# Patient Record
Sex: Female | Born: 1935 | Race: White | Hispanic: No | Smoking: Never smoker
Health system: Southern US, Community
[De-identification: ages and names within clinical notes are randomized; demographics above are authoritative.]

## PROBLEM LIST (undated history)

## (undated) DIAGNOSIS — J45909 Unspecified asthma, uncomplicated: Secondary | ICD-10-CM

## (undated) DIAGNOSIS — I10 Essential (primary) hypertension: Secondary | ICD-10-CM

## (undated) DIAGNOSIS — E785 Hyperlipidemia, unspecified: Secondary | ICD-10-CM

## (undated) DIAGNOSIS — F0390 Unspecified dementia without behavioral disturbance: Secondary | ICD-10-CM

---

## 1998-06-08 ENCOUNTER — Ambulatory Visit (HOSPITAL_COMMUNITY): Admission: RE | Admit: 1998-06-08 | Discharge: 1998-06-08 | Payer: Self-pay | Admitting: Family Medicine

## 1999-06-20 ENCOUNTER — Encounter: Payer: Self-pay | Admitting: Family Medicine

## 1999-06-20 ENCOUNTER — Encounter: Admission: RE | Admit: 1999-06-20 | Discharge: 1999-06-20 | Payer: Self-pay | Admitting: Family Medicine

## 2000-06-30 ENCOUNTER — Ambulatory Visit (HOSPITAL_COMMUNITY): Admission: RE | Admit: 2000-06-30 | Discharge: 2000-06-30 | Payer: Self-pay | Admitting: Family Medicine

## 2000-06-30 ENCOUNTER — Encounter: Payer: Self-pay | Admitting: Family Medicine

## 2001-07-02 ENCOUNTER — Ambulatory Visit (HOSPITAL_COMMUNITY): Admission: RE | Admit: 2001-07-02 | Discharge: 2001-07-02 | Payer: Self-pay | Admitting: Family Medicine

## 2001-07-02 ENCOUNTER — Encounter: Payer: Self-pay | Admitting: Family Medicine

## 2002-07-09 ENCOUNTER — Other Ambulatory Visit: Admission: RE | Admit: 2002-07-09 | Discharge: 2002-07-09 | Payer: Self-pay | Admitting: Family Medicine

## 2002-08-11 ENCOUNTER — Ambulatory Visit (HOSPITAL_COMMUNITY): Admission: RE | Admit: 2002-08-11 | Discharge: 2002-08-11 | Payer: Self-pay | Admitting: Family Medicine

## 2002-08-11 ENCOUNTER — Encounter: Payer: Self-pay | Admitting: Family Medicine

## 2003-10-18 ENCOUNTER — Ambulatory Visit (HOSPITAL_COMMUNITY): Admission: RE | Admit: 2003-10-18 | Discharge: 2003-10-18 | Payer: Self-pay | Admitting: Family Medicine

## 2004-07-23 ENCOUNTER — Ambulatory Visit: Payer: Self-pay | Admitting: Family Medicine

## 2004-10-18 ENCOUNTER — Ambulatory Visit (HOSPITAL_COMMUNITY): Admission: RE | Admit: 2004-10-18 | Discharge: 2004-10-18 | Payer: Self-pay | Admitting: Family Medicine

## 2004-11-28 ENCOUNTER — Ambulatory Visit: Payer: Self-pay | Admitting: Family Medicine

## 2004-12-18 ENCOUNTER — Ambulatory Visit: Payer: Self-pay | Admitting: Gastroenterology

## 2005-01-01 ENCOUNTER — Ambulatory Visit: Payer: Self-pay | Admitting: Gastroenterology

## 2005-01-29 ENCOUNTER — Ambulatory Visit: Payer: Self-pay | Admitting: Family Medicine

## 2005-07-01 ENCOUNTER — Ambulatory Visit: Payer: Self-pay | Admitting: Family Medicine

## 2005-07-10 ENCOUNTER — Ambulatory Visit: Payer: Self-pay | Admitting: Family Medicine

## 2005-10-10 ENCOUNTER — Ambulatory Visit: Payer: Self-pay | Admitting: Family Medicine

## 2005-11-19 ENCOUNTER — Ambulatory Visit (HOSPITAL_COMMUNITY): Admission: RE | Admit: 2005-11-19 | Discharge: 2005-11-19 | Payer: Self-pay | Admitting: Family Medicine

## 2006-01-28 ENCOUNTER — Ambulatory Visit: Payer: Self-pay | Admitting: Family Medicine

## 2006-02-17 ENCOUNTER — Ambulatory Visit: Payer: Self-pay | Admitting: Family Medicine

## 2006-05-14 ENCOUNTER — Ambulatory Visit: Payer: Self-pay | Admitting: Family Medicine

## 2006-05-14 LAB — CONVERTED CEMR LAB
Hgb A1c MFr Bld: 6.4 % — ABNORMAL HIGH (ref 4.6–6.0)
Microalb Creat Ratio: 16.8 mg/g (ref 0.0–30.0)
Microalb, Ur: 2.1 mg/dL — ABNORMAL HIGH (ref 0.0–1.9)

## 2006-05-26 ENCOUNTER — Ambulatory Visit: Payer: Self-pay | Admitting: Family Medicine

## 2006-11-26 ENCOUNTER — Ambulatory Visit (HOSPITAL_COMMUNITY): Admission: RE | Admit: 2006-11-26 | Discharge: 2006-11-26 | Payer: Self-pay | Admitting: Unknown Physician Specialty

## 2011-07-15 ENCOUNTER — Ambulatory Visit: Payer: Self-pay | Admitting: Family Medicine

## 2012-09-30 ENCOUNTER — Ambulatory Visit: Payer: Self-pay | Admitting: Family Medicine

## 2012-10-21 ENCOUNTER — Ambulatory Visit: Payer: Self-pay | Admitting: Family Medicine

## 2015-06-20 DIAGNOSIS — B351 Tinea unguium: Secondary | ICD-10-CM | POA: Diagnosis not present

## 2015-06-20 DIAGNOSIS — M79676 Pain in unspecified toe(s): Secondary | ICD-10-CM | POA: Diagnosis not present

## 2015-06-20 DIAGNOSIS — M24576 Contracture, unspecified foot: Secondary | ICD-10-CM | POA: Diagnosis not present

## 2015-06-20 DIAGNOSIS — M2010 Hallux valgus (acquired), unspecified foot: Secondary | ICD-10-CM | POA: Diagnosis not present

## 2015-07-04 ENCOUNTER — Emergency Department: Payer: Medicare Other

## 2015-07-04 ENCOUNTER — Inpatient Hospital Stay
Admission: EM | Admit: 2015-07-04 | Discharge: 2015-07-07 | DRG: 550 | Disposition: A | Discharge status: Skilled Nursing Facility | Payer: Medicare Other | Attending: Internal Medicine | Admitting: Internal Medicine

## 2015-07-04 ENCOUNTER — Inpatient Hospital Stay: Payer: Medicare Other

## 2015-07-04 ENCOUNTER — Encounter: Payer: Self-pay | Admitting: Medical Oncology

## 2015-07-04 DIAGNOSIS — M25462 Effusion, left knee: Secondary | ICD-10-CM | POA: Diagnosis present

## 2015-07-04 DIAGNOSIS — M25562 Pain in left knee: Secondary | ICD-10-CM

## 2015-07-04 DIAGNOSIS — M11262 Other chondrocalcinosis, left knee: Secondary | ICD-10-CM | POA: Diagnosis not present

## 2015-07-04 DIAGNOSIS — R509 Fever, unspecified: Secondary | ICD-10-CM | POA: Diagnosis not present

## 2015-07-04 DIAGNOSIS — F0390 Unspecified dementia without behavioral disturbance: Secondary | ICD-10-CM | POA: Diagnosis present

## 2015-07-04 DIAGNOSIS — J45909 Unspecified asthma, uncomplicated: Secondary | ICD-10-CM | POA: Diagnosis not present

## 2015-07-04 DIAGNOSIS — M009 Pyogenic arthritis, unspecified: Principal | ICD-10-CM | POA: Diagnosis present

## 2015-07-04 DIAGNOSIS — R531 Weakness: Secondary | ICD-10-CM | POA: Diagnosis not present

## 2015-07-04 DIAGNOSIS — E559 Vitamin D deficiency, unspecified: Secondary | ICD-10-CM | POA: Diagnosis not present

## 2015-07-04 DIAGNOSIS — I10 Essential (primary) hypertension: Secondary | ICD-10-CM | POA: Diagnosis present

## 2015-07-04 DIAGNOSIS — J449 Chronic obstructive pulmonary disease, unspecified: Secondary | ICD-10-CM | POA: Diagnosis not present

## 2015-07-04 DIAGNOSIS — E785 Hyperlipidemia, unspecified: Secondary | ICD-10-CM | POA: Diagnosis present

## 2015-07-04 DIAGNOSIS — M25511 Pain in right shoulder: Secondary | ICD-10-CM

## 2015-07-04 DIAGNOSIS — Z7982 Long term (current) use of aspirin: Secondary | ICD-10-CM | POA: Diagnosis not present

## 2015-07-04 DIAGNOSIS — R0602 Shortness of breath: Secondary | ICD-10-CM | POA: Diagnosis not present

## 2015-07-04 DIAGNOSIS — Z66 Do not resuscitate: Secondary | ICD-10-CM | POA: Diagnosis present

## 2015-07-04 DIAGNOSIS — M11869 Other specified crystal arthropathies, unspecified knee: Secondary | ICD-10-CM | POA: Diagnosis not present

## 2015-07-04 HISTORY — DX: Hyperlipidemia, unspecified: E78.5

## 2015-07-04 HISTORY — DX: Unspecified asthma, uncomplicated: J45.909

## 2015-07-04 HISTORY — DX: Essential (primary) hypertension: I10

## 2015-07-04 HISTORY — DX: Unspecified dementia, unspecified severity, without behavioral disturbance, psychotic disturbance, mood disturbance, and anxiety: F03.90

## 2015-07-04 LAB — SYNOVIAL CELL COUNT + DIFF, W/ CRYSTALS
EOSINOPHILS-SYNOVIAL: 0 %
Lymphocytes-Synovial Fld: 6 %
MONOCYTE-MACROPHAGE-SYNOVIAL FLUID: 6 %
Neutrophil, Synovial: 88 %
OTHER CELLS-SYN: 0
WBC, SYNOVIAL: 29743 /mm3 — AB (ref 0–200)

## 2015-07-04 LAB — CBC WITH DIFFERENTIAL/PLATELET
Basophils Absolute: 0.1 10*3/uL (ref 0–0.1)
Basophils Relative: 1 %
EOS ABS: 0 10*3/uL (ref 0–0.7)
Eosinophils Relative: 0 %
HCT: 37.2 % (ref 35.0–47.0)
HEMOGLOBIN: 12.5 g/dL (ref 12.0–16.0)
LYMPHS ABS: 1 10*3/uL (ref 1.0–3.6)
Lymphocytes Relative: 8 %
MCH: 30.6 pg (ref 26.0–34.0)
MCHC: 33.5 g/dL (ref 32.0–36.0)
MCV: 91.2 fL (ref 80.0–100.0)
Monocytes Absolute: 1.3 10*3/uL — ABNORMAL HIGH (ref 0.2–0.9)
Monocytes Relative: 11 %
NEUTROS PCT: 80 %
Neutro Abs: 9.3 10*3/uL — ABNORMAL HIGH (ref 1.4–6.5)
PLATELETS: 189 10*3/uL (ref 150–440)
RBC: 4.08 MIL/uL (ref 3.80–5.20)
RDW: 14.2 % (ref 11.5–14.5)
WBC: 11.6 10*3/uL — AB (ref 3.6–11.0)

## 2015-07-04 LAB — RAPID INFLUENZA A&B ANTIGENS (ARMC ONLY): INFLUENZA B (ARMC): NEGATIVE

## 2015-07-04 LAB — RAPID INFLUENZA A&B ANTIGENS: Influenza A (ARMC): NEGATIVE

## 2015-07-04 LAB — COMPREHENSIVE METABOLIC PANEL
ALBUMIN: 3.4 g/dL — AB (ref 3.5–5.0)
ALK PHOS: 78 U/L (ref 38–126)
ALT: 30 U/L (ref 14–54)
ANION GAP: 11 (ref 5–15)
AST: 29 U/L (ref 15–41)
BILIRUBIN TOTAL: 1.5 mg/dL — AB (ref 0.3–1.2)
BUN: 14 mg/dL (ref 6–20)
CHLORIDE: 103 mmol/L (ref 101–111)
CO2: 25 mmol/L (ref 22–32)
CREATININE: 0.87 mg/dL (ref 0.44–1.00)
Calcium: 9.2 mg/dL (ref 8.9–10.3)
GFR calc Af Amer: >60 mL/min (ref >60)
GFR, EST NON AFRICAN AMERICAN: 59 mL/min — AB (ref >60)
Glucose, Bld: 161 mg/dL — ABNORMAL HIGH (ref 65–99)
POTASSIUM: 4 mmol/L (ref 3.5–5.1)
Sodium: 139 mmol/L (ref 135–145)
Total Protein: 7.1 g/dL (ref 6.5–8.1)

## 2015-07-04 LAB — URINALYSIS COMPLETE WITH MICROSCOPIC (ARMC ONLY)
BILIRUBIN URINE: NEGATIVE
Bacteria, UA: NONE SEEN
Glucose, UA: 50 mg/dL — AB
Hgb urine dipstick: NEGATIVE
KETONES UR: NEGATIVE mg/dL
Leukocytes, UA: NEGATIVE
Nitrite: NEGATIVE
Protein, ur: 30 mg/dL — AB
Specific Gravity, Urine: 1.023 (ref 1.005–1.030)
pH: 5 (ref 5.0–8.0)

## 2015-07-04 LAB — TROPONIN I

## 2015-07-04 LAB — MRSA PCR SCREENING: MRSA BY PCR: NEGATIVE

## 2015-07-04 LAB — SEDIMENTATION RATE: Sed Rate: 77 mm/hr — ABNORMAL HIGH (ref 0–30)

## 2015-07-04 MED ORDER — ALBUTEROL SULFATE (2.5 MG/3ML) 0.083% IN NEBU: 2.5000 mg | INHALATION_SOLUTION | Freq: Four times a day (QID) | RESPIRATORY_TRACT | Status: DC | PRN

## 2015-07-04 MED ORDER — ENOXAPARIN SODIUM 40 MG/0.4ML ~~LOC~~ SOLN
40.0000 mg | SUBCUTANEOUS | Status: DC
  Administered 2015-07-04 – 2015-07-07 (×4): 40 mg via SUBCUTANEOUS
  Filled 2015-07-04 (×3): qty 0.4

## 2015-07-04 MED ORDER — ALBUTEROL SULFATE (2.5 MG/3ML) 0.083% IN NEBU
2.5000 mg | INHALATION_SOLUTION | Freq: Two times a day (BID) | RESPIRATORY_TRACT | Status: DC
  Filled 2015-07-04: qty 3

## 2015-07-04 MED ORDER — HYDROCODONE-ACETAMINOPHEN 5-325 MG PO TABS
1.0000 | ORAL_TABLET | ORAL | Status: DC | PRN
  Administered 2015-07-04: 1 via ORAL
  Filled 2015-07-04: qty 1

## 2015-07-04 MED ORDER — MEMANTINE HCL 10 MG PO TABS
5.0000 mg | ORAL_TABLET | Freq: Two times a day (BID) | ORAL | Status: DC
  Administered 2015-07-04 – 2015-07-07 (×7): 5 mg via ORAL
  Filled 2015-07-04 (×7): qty 1

## 2015-07-04 MED ORDER — ACETAMINOPHEN 325 MG PO TABS: 650.0000 mg | ORAL_TABLET | Freq: Four times a day (QID) | ORAL | Status: DC | PRN

## 2015-07-04 MED ORDER — MORPHINE SULFATE (PF) 2 MG/ML IV SOLN
2.0000 mg | Freq: Once | INTRAVENOUS | Status: AC
  Administered 2015-07-04: 2 mg via INTRAVENOUS
  Filled 2015-07-04: qty 1

## 2015-07-04 MED ORDER — VANCOMYCIN HCL IN DEXTROSE 750-5 MG/150ML-% IV SOLN
750.0000 mg | INTRAVENOUS | Status: DC
  Administered 2015-07-05 – 2015-07-06 (×3): 750 mg via INTRAVENOUS
  Filled 2015-07-04 (×5): qty 150

## 2015-07-04 MED ORDER — SODIUM CHLORIDE 0.9 % IV BOLUS (SEPSIS)
500.0000 mL | Freq: Once | INTRAVENOUS | Status: AC
  Administered 2015-07-04: 500 mL via INTRAVENOUS

## 2015-07-04 MED ORDER — ACETAMINOPHEN 325 MG PO TABS: 650.0000 mg | ORAL_TABLET | Freq: Three times a day (TID) | ORAL | Status: DC | PRN

## 2015-07-04 MED ORDER — BISACODYL 5 MG PO TBEC: 5.0000 mg | DELAYED_RELEASE_TABLET | Freq: Every day | ORAL | Status: DC | PRN

## 2015-07-04 MED ORDER — VANCOMYCIN HCL IN DEXTROSE 750-5 MG/150ML-% IV SOLN
750.0000 mg | INTRAVENOUS | Status: AC
  Administered 2015-07-04: 750 mg via INTRAVENOUS
  Filled 2015-07-04: qty 150

## 2015-07-04 MED ORDER — ONDANSETRON HCL 4 MG/2ML IJ SOLN: 4.0000 mg | Freq: Four times a day (QID) | INTRAMUSCULAR | Status: DC | PRN

## 2015-07-04 MED ORDER — ALBUTEROL SULFATE (2.5 MG/3ML) 0.083% IN NEBU
2.5000 mg | INHALATION_SOLUTION | Freq: Two times a day (BID) | RESPIRATORY_TRACT | Status: DC
  Administered 2015-07-05 – 2015-07-07 (×4): 2.5 mg via RESPIRATORY_TRACT
  Filled 2015-07-04 (×4): qty 3

## 2015-07-04 MED ORDER — TRAZODONE HCL 50 MG PO TABS
25.0000 mg | ORAL_TABLET | Freq: Every evening | ORAL | Status: DC | PRN
  Administered 2015-07-06: 25 mg via ORAL
  Filled 2015-07-04: qty 1

## 2015-07-04 MED ORDER — ACETAMINOPHEN 650 MG RE SUPP: 650.0000 mg | Freq: Four times a day (QID) | RECTAL | Status: DC | PRN

## 2015-07-04 MED ORDER — KETOROLAC TROMETHAMINE 30 MG/ML IJ SOLN
15.0000 mg | Freq: Once | INTRAMUSCULAR | Status: AC
  Administered 2015-07-04: 15 mg via INTRAVENOUS
  Filled 2015-07-04: qty 1

## 2015-07-04 MED ORDER — CEFAZOLIN SODIUM 1-5 GM-% IV SOLN
1.0000 g | Freq: Once | INTRAVENOUS | Status: AC
  Administered 2015-07-04: 1 g via INTRAVENOUS
  Filled 2015-07-04: qty 50

## 2015-07-04 MED ORDER — LEVOFLOXACIN IN D5W 250 MG/50ML IV SOLN: 250.0000 mg | INTRAVENOUS | Status: DC

## 2015-07-04 MED ORDER — SODIUM CHLORIDE 0.9 % IV SOLN
INTRAVENOUS | Status: DC
  Administered 2015-07-04 – 2015-07-06 (×3): via INTRAVENOUS

## 2015-07-04 MED ORDER — LEVOFLOXACIN IN D5W 500 MG/100ML IV SOLN: 500.0000 mg | INTRAVENOUS | Status: DC

## 2015-07-04 MED ORDER — ASPIRIN 81 MG PO CHEW
81.0000 mg | CHEWABLE_TABLET | Freq: Every day | ORAL | Status: DC
  Administered 2015-07-04 – 2015-07-07 (×4): 81 mg via ORAL
  Filled 2015-07-04 (×4): qty 1

## 2015-07-04 MED ORDER — DOCUSATE SODIUM 100 MG PO CAPS
100.0000 mg | ORAL_CAPSULE | Freq: Two times a day (BID) | ORAL | Status: DC
  Administered 2015-07-04 – 2015-07-07 (×7): 100 mg via ORAL
  Filled 2015-07-04 (×7): qty 1

## 2015-07-04 MED ORDER — AMLODIPINE BESYLATE 5 MG PO TABS
2.5000 mg | ORAL_TABLET | Freq: Every day | ORAL | Status: DC
  Administered 2015-07-04 – 2015-07-07 (×4): 2.5 mg via ORAL
  Filled 2015-07-04 (×4): qty 1

## 2015-07-04 MED ORDER — PRAVASTATIN SODIUM 20 MG PO TABS
40.0000 mg | ORAL_TABLET | Freq: Every day | ORAL | Status: DC
  Administered 2015-07-04 – 2015-07-07 (×4): 40 mg via ORAL
  Filled 2015-07-04 (×4): qty 2

## 2015-07-04 MED ORDER — DONEPEZIL HCL 5 MG PO TABS
5.0000 mg | ORAL_TABLET | Freq: Every day | ORAL | Status: DC
  Administered 2015-07-04 – 2015-07-06 (×3): 5 mg via ORAL
  Filled 2015-07-04 (×3): qty 1

## 2015-07-04 MED ORDER — ONDANSETRON HCL 4 MG PO TABS: 4.0000 mg | ORAL_TABLET | Freq: Four times a day (QID) | ORAL | Status: DC | PRN

## 2015-07-04 MED ORDER — IPRATROPIUM-ALBUTEROL 0.5-2.5 (3) MG/3ML IN SOLN
3.0000 mL | Freq: Once | RESPIRATORY_TRACT | Status: AC
  Administered 2015-07-04: 3 mL via RESPIRATORY_TRACT
  Filled 2015-07-04: qty 3

## 2015-07-04 MED ORDER — VANCOMYCIN HCL IN DEXTROSE 1-5 GM/200ML-% IV SOLN: 1000.0000 mg | INTRAVENOUS | Status: DC

## 2015-07-04 MED ORDER — CITALOPRAM HYDROBROMIDE 20 MG PO TABS
20.0000 mg | ORAL_TABLET | Freq: Every day | ORAL | Status: DC
  Administered 2015-07-04 – 2015-07-07 (×4): 20 mg via ORAL
  Filled 2015-07-04 (×4): qty 1

## 2015-07-04 NOTE — ED Notes (Signed)
[facility] staff reports that pt woke up c/o pain all over, pt reports pain to knee. Pt confused per baseline. Staff reports pt may have uti. Pt afebrile.

## 2015-07-04 NOTE — ED Notes (Signed)
Pt will be transported to [facility] once returns from [facility]. Pt's family made aware and verbalized understanding.

## 2015-07-04 NOTE — Consult Note (Signed)
Patient is seen for evaluation of left knee swelling pain and warmth, possible septic joint. She is confused and a poor historian her daughters with her and helps explain was been going on. She does have significant osteoarthritis but did not want to have knee replacement many years ago and is essentially bone been bone-on-bone in the left knee. She has not had a swelling like this in the past however.  On examination she has a very large effusion to the left knee with pain with any attempted motion, she holds the knee in slight flexion. There is some warmth to the knee. She is tender diffusely.  X-rays were reviewed and show significant tricompartmental osteoarthritis with some chondrocalcinosis  Impression acutely swollen knee with warmth, possible septic joint, possible gout or pseudogout attack  Plan knee was aspirated and 40 cc of fluid withdrawn that was cloudy this will be sent for synovial fluid analysis and culture. If it is pseudogout or gout will come back tomorrow and inject with corticosteroid if positive for infection would recommend arthroscopy

## 2015-07-04 NOTE — Progress Notes (Addendum)
Pharmacy Antibiotic Note  Maurine Ministerhelma A Marques is a 82 y.o. female admitted on 07/04/2015 with fever of unknown origin, r/o septic arthritis.  Pharmacy has been consulted for vancomycin dosing.  Ke: 0.042 Vd: 42.3  Plan: Vancomycin 750 mg iv q 18 hours with stacked dosing and a trough with the 5th total dose. Goal trough 15-20 mcg/ml.   Height: 5\' 1"  (154.9 cm) Weight: 175 lb (79.379 kg) IBW/kg (Calculated) : 47.8  Temp (24hrs), Avg:99.4 F (37.4 C), Min:98.3 F (36.8 C), Max:100.4 F (38 C)   Recent Labs Lab 07/04/15 0827  WBC 11.6*  CREATININE 0.87    Estimated Creatinine Clearance: 45.1 mL/min (by C-G formula based on Cr of 0.87).    No Known Allergies  Antimicrobials this admission: cefazolin 4/25 >> 4/25 Vancomycin 4/25 >>   Dose adjustments this admission:   Microbiology results: BCx: pending UCx: pending    Thank you for allowing pharmacy to be a part of this patient's care.  Luisa HartChristy, Kezia Benevides D 07/04/2015 2:12 PM

## 2015-07-04 NOTE — H&P (Addendum)
[facility] [place] NAME: Stephanie Duarte    MR#:  [id number]  DATE OF BIRTH:  Jul 14, 1928  DATE OF ADMISSION:  07/04/2015  PRIMARY CARE PHYSICIAN: No primary care provider on file.   REQUESTING/REFERRING PHYSICIAN: Dr. Meade Maw  CHIEF COMPLAINT: Left knee pain and right shoulder pain    Chief Complaint  Patient presents with  . Generalized Body Aches  . Knee Pain    HISTORY OF PRESENT ILLNESS:  Stephanie Duarte  is a 82 y.o. female with a known history of Dementia, hypertension, asthma sent in from [place] resolved. Dementia care unit secondary to severe left knee pain and unable to ambulate since this morning. Patient also had right shoulder pain. Patient usually walks with walker but this morning she couldn't breath and she had severe pain and for screening because of the pain in the left knee. The patient had temperature of 100.6 f. According to the family s,he had ambulatory difficulties for the past few days. Decreased by mouth intake today.  PAST MEDICAL HISTORY:   Past Medical History  Diagnosis Date  . Dementia   . Hypertension   . Hyperlipidemia   . Asthma     PAST SURGICAL HISTOIRY:  No past surgical history on file. History of spinal surgeries for the bone spur, lower back surgery for herniated disc, laparoscopic knee surgery on the left knee. SOCIAL HISTORY:   Social History  Substance Use Topics  . Smoking status: Never Smoker   . Smokeless tobacco: Not on file  . Alcohol Use: Not on file    FAMILY HISTORY:  No family history on file.  DRUG ALLERGIES:  No Known Allergies  REVIEW OF SYSTEMS:Unable to obtain review of systems secondary to dementia     MEDICATIONS AT HOME:   Prior to Admission medications   Medication Sig Start Date End Date Taking? Authorizing Provider  acetaminophen (TYLENOL) 325 MG tablet Take 650 mg by mouth 3 (three) times daily as needed for mild pain.   Yes Historical Provider,  MD  albuterol (ACCUNEB) 0.63 MG/3ML nebulizer solution Take 1 ampule by nebulization 2 (two) times daily. Pt also uses every six hours as needed for wheezing.   Yes Historical Provider, MD  albuterol (PROVENTIL HFA;VENTOLIN HFA) 108 (90 Base) MCG/ACT inhaler Inhale 2 puffs into the lungs every 6 (six) hours as needed for wheezing or shortness of breath (and/or cough).   Yes Historical Provider, MD  amLODipine (NORVASC) 2.5 MG tablet Take 2.5 mg by mouth daily.   Yes Historical Provider, MD  aspirin 81 MG chewable tablet Chew 81 mg by mouth daily.   Yes Historical Provider, MD  cholecalciferol (VITAMIN D) 1000 units tablet Take 4,000 Units by mouth daily.   Yes Historical Provider, MD  citalopram (CELEXA) 20 MG tablet Take 20 mg by mouth daily.   Yes Historical Provider, MD  donepezil (ARICEPT) 5 MG tablet Take 5 mg by mouth at bedtime.   Yes Historical Provider, MD  memantine (NAMENDA) 5 MG tablet Take 5 mg by mouth 2 (two) times daily.   Yes Historical Provider, MD  pravastatin (PRAVACHOL) 40 MG tablet Take 40 mg by mouth daily.   Yes Historical Provider, MD      VITAL SIGNS:  Blood pressure 137/72, pulse 88, temperature 100.4 F (38 C), temperature source Rectal, resp. rate 16, height '5\' 1"'$  (1.549 m), weight 79.379 kg (175 lb), SpO2 98 %.  PHYSICAL EXAMINATION:  GENERAL:  82 y.o.-year-old patient  lying in the bed with left knee pain EYES: Pupils equal, round, reactive to light and accommodation. No scleral icterus. Extraocular muscles intact.  HEENT: Head atraumatic, normocephalic. Oropharynx and nasopharynx clear.  NECK:  Supple, no jugular venous distention. No thyroid enlargement, no tenderness.  LUNGS: Normal breath sounds bilaterally, no wheezing, rales,rhonchi or crepitation. No use of accessory muscles of respiration.  CARDIOVASCULAR: S1, S2 normal. No murmurs, rubs, or gallops.  ABDOMEN: Soft, nontender, nondistended. Bowel sounds present. No organomegaly or mass.  EXTREMITIES:  Left knee is swollen, tender to palpation at the medial aspect no redness or erythema. Decreased range of motion secondary to pain. Right knee is not swollen, range of motion is within normal limits.  NEUROLOGIC: Cranial nerves II through XII are intact. Muscle strength 5/5 in all extremities. Sensation intact. Gait not checked.  PSYCHIATRIC: The patient is awake but demented.  SKIN: No obvious rash, lesion, or ulcer.   LABORATORY PANEL:   CBC  Recent Labs Lab 07/04/15 0827  WBC 11.6*  HGB 12.5  HCT 37.2  PLT 189   ------------------------------------------------------------------------------------------------------------------  Chemistries   Recent Labs Lab 07/04/15 0827  NA 139  K 4.0  CL 103  CO2 25  GLUCOSE 161*  BUN 14  CREATININE 0.87  CALCIUM 9.2  AST 29  ALT 30  ALKPHOS 78  BILITOT 1.5*   ------------------------------------------------------------------------------------------------------------------  Cardiac Enzymes  Recent Labs Lab 07/04/15 0827  TROPONINI <0.03   ------------------------------------------------------------------------------------------------------------------  RADIOLOGY:  Dg Chest 2 View  07/04/2015  CLINICAL DATA:  Shortness of breath.  Diffuse pain. EXAM: CHEST  2 VIEW COMPARISON:  10/21/2012 FINDINGS: The cardiac silhouette is upper limits of normal in size. There is increased density in the upper mediastinum to the right of the trachea which is more prominent than on the prior study though not completely new, in there is mild leftward deviation of the trachea at the thoracic inlet. Atherosclerotic calcification and tortuosity are again seen of the thoracic aorta. No airspace consolidation, edema, pleural effusion, pneumothorax is identified. No acute osseous abnormality is seen. IMPRESSION: 1. No evidence of acute cardiopulmonary process. 2. Increased right paratracheal density with mild mass effect on the trachea at the thoracic  inlet, possibly reflecting thyroid enlargement although other mediastinal mass and vascular compression are also considerations. Consider further evaluation with outpatient chest CT. Electronically Signed   By: Logan Bores M.D.   On: 07/04/2015 09:35   Dg Shoulder Right  07/04/2015  CLINICAL DATA:  All over pain of right shoulder.  No injury. EXAM: RIGHT SHOULDER - 2+ VIEW COMPARISON:  None. FINDINGS: There is no evidence of fracture or dislocation. There are degenerative joint changes with narrowed joint of the right shoulder. Chronic posttraumatic deformity is identified in one of the lower right ribs. Soft tissues are unremarkable. IMPRESSION: No acute fracture or dislocation. Degenerative joint changes of right shoulder. Electronically Signed   By: Abelardo Diesel M.D.   On: 07/04/2015 09:35   Dg Knee Complete 4 Views Left  07/04/2015  CLINICAL DATA:  Pain to the left knee with no injury. EXAM: LEFT KNEE - COMPLETE 4+ VIEW COMPARISON:  None. FINDINGS: There is no evidence of fracture, dislocation. There is a suprapatellar effusion. There are degenerative joint changes of left knee with narrowed sub joint space and osteophyte formation. Chondrocalcinosis is identified in the femoral tibial joint space. Soft tissues are unremarkable. IMPRESSION: No acute fracture or dislocation. Degenerative joint changes of left knee. Electronically Signed   By: Mallie Darting.D.  On: 07/04/2015 09:34    EKG:  No orders found for this or any previous visit.  IMPRESSION AND PLAN:   #1 fever with left knee swelling and pain concerning for septic arthritis: Patient initial workup with x-ray of the left knee showed bony degeneration but  ESR is  77.will get  Ortho consult for arthoscentesis and synovial fluid analysis.check CRP also got empiric antibiotic with s vancomycin but cover for MRSA as the patient is from nursing home.   #2 dementia;continue namenda 3. Given her elevated white count and a left knee pain  concerning for septic arthritis but unable to rule out inflammatory arthritis like gout, possible UTI: Blood cultures and urine cultures are ordered. Influenza test is negative. Continue pain medications with Percocet. #4 ambulatory difficulty probably secondary to knee pain:'  #5 CODE STATUS DO NOT RESUSCITATE Discussed with patient's daughter   All the records are reviewed and case discussed with ED provider. Management plans discussed with the patient, family and they are in agreement.  CODE STATUS:dnr   TOTAL TIME TAKING CARE OF THIS PATIENT:55 minutes.    Epifanio Lesches M.D on 07/04/2015 at 2:06 PM  Between 7am to 6pm - Pager - [phone]  After 6pm go to [email or web address] - password [email or web address] [facility]  [facility]  Office  [phone]  CC: Primary care physician; No primary care provider on file.  Note: This dictation was prepared with Dragon dictation along with smaller phrase technology. Any transcriptional errors that result from this process are unintentional.

## 2015-07-04 NOTE — ED Notes (Signed)
Patient transported to Ultrasound 

## 2015-07-04 NOTE — ED Provider Notes (Signed)
Time Seen: Approximately 0 900 I have reviewed the triage notes  Chief Complaint: Generalized Body Aches and Knee Pain   History of Present Illness: Stephanie Duarte is a 82 y.o. female who presents with complaints of some diffuse body aches. Patient's had a history of inflammation in her left knee and the family states that she has had some difficulty ambulating over the last couple of days. Patient seems to be slightly confused per her baseline. She does have a history of dementia. The patient outside of her knee pain is complained of right shoulder pain. She's had a dry nonproductive cough. The patient denies any chest pain at this time. She denies any abdominal discomfort. There is been no loose stool or diarrhea. No nausea or vomiting. The family states that the patient's also complaining of right shoulder pain.   Past Medical History  Diagnosis Date  . Dementia   . Hypertension   . Hyperlipidemia   . Asthma     There are no active problems to display for this patient.   No past surgical history on file.  No past surgical history on file.  No current outpatient prescriptions on file.  Allergies:  Review of patient's allergies indicates no known allergies.  Family History: No family history on file.  Social History: Social History  Substance Use Topics  . Smoking status: Never Smoker   . Smokeless tobacco: None  . Alcohol Use: None     Review of Systems:   10 point review of systems was performed and was otherwise negative:  Constitutional:Fever noted rectally here today Eyes: No visual disturbances ENT: No sore throat, ear pain Cardiac: No chest pain Respiratory: No shortness of breath, wheezing, or stridor Abdomen: No abdominal pain, no vomiting, No diarrhea Endocrine: No weight loss, No night sweats Extremities: No peripheral edema, cyanosis Skin: No rashes, easy bruising Neurologic: No focal weakness, trouble with speech or swollowing Urologic: No  dysuria, Hematuria, or urinary frequency   Physical Exam:  ED Triage Vitals  Enc Vitals Group     BP 07/04/15 0817 144/78 mmHg     Pulse Rate 07/04/15 0817 90     Resp 07/04/15 0817 20     Temp 07/04/15 0817 98.3 F (36.8 C)     Temp Source 07/04/15 0817 Oral     SpO2 07/04/15 0817 92 %     Weight 07/04/15 0817 175 lb (79.379 kg)     Height 07/04/15 0817 5\' 1"  (1.549 m)     Head Cir --      Peak Flow --      Pain Score --      Pain Loc --      Pain Edu? --      Excl. in GC? --     General: Awake , Alert , and Oriented times 1. Patient's cooperative and able to follow commands Head: Normal cephalic , atraumatic Eyes: Pupils equal , round, reactive to light Nose/Throat: No nasal drainage, patent upper airway without erythema or exudate.  Neck: Supple, Full range of motion, No anterior adenopathy or palpable thyroid masses Lungs: Some rhonchi auscultated bilaterally to bases without any wheezes or rales noted  Heart: Regular rate, regular rhythm without murmurs , gallops , or rubs Abdomen: Soft, non tender without rebound, guarding , or rigidity; bowel sounds positive and symmetric in all 4 quadrants. No organomegaly .        Extremities patient has some mild edema and diffuse tenderness across the left  knee without any ballottement or obvious erythema. Limited range of motion secondary to pain Neurologic: normal ambulation, Motor symmetric without deficits, sensory intact Skin: warm, dry, no rashes   Labs:   All laboratory work was reviewed including any pertinent negatives or positives listed below:  Labs Reviewed  COMPREHENSIVE METABOLIC PANEL - Abnormal; Notable for the following:    Glucose, Bld 161 (*)    Albumin 3.4 (*)    Total Bilirubin 1.5 (*)    GFR calc non Af Amer 59 (*)    All other components within normal limits  CBC WITH DIFFERENTIAL/PLATELET - Abnormal; Notable for the following:    WBC 11.6 (*)    Neutro Abs 9.3 (*)    Monocytes Absolute 1.3 (*)     All other components within normal limits  URINALYSIS COMPLETEWITH MICROSCOPIC ([facility] ONLY) - Abnormal; Notable for the following:    Color, Urine AMBER (*)    APPearance CLEAR (*)    Glucose, UA 50 (*)    Protein, ur 30 (*)    Squamous Epithelial / LPF 0-5 (*)    All other components within normal limits  SEDIMENTATION RATE - Abnormal; Notable for the following:    Sed Rate 77 (*)    All other components within normal limits  RAPID INFLUENZA A&B ANTIGENS ([facility] ONLY)  CULTURE, BLOOD (ROUTINE X 2)  CULTURE, BLOOD (ROUTINE X 2)  URINE CULTURE  TROPONIN I    Radiology:    EXAM: CHEST 2 VIEW  COMPARISON: 10/21/2012  FINDINGS: The cardiac silhouette is upper limits of normal in size. There is increased density in the upper mediastinum to the right of the trachea which is more prominent than on the prior study though not completely new, in there is mild leftward deviation of the trachea at the thoracic inlet. Atherosclerotic calcification and tortuosity are again seen of the thoracic aorta. No airspace consolidation, edema, pleural effusion, pneumothorax is identified. No acute osseous abnormality is seen.  IMPRESSION: 1. No evidence of acute cardiopulmonary process. 2. Increased right paratracheal density with mild mass effect on the trachea at the thoracic inlet, possibly reflecting thyroid enlargement although other mediastinal mass and vascular compression are also considerations. Consider further evaluation with outpatient chest CT.   Electronically Signed By: Sebastian Ache M.D. On: 07/04/2015 09:35          DG Knee Complete 4 Views Left (Final result) Result time: 07/04/15 09:34:16   Final result by Rad Results In Interface (07/04/15 09:34:16)   Narrative:   CLINICAL DATA: Pain to the left knee with no injury.  EXAM: LEFT KNEE - COMPLETE 4+ VIEW  COMPARISON: None.  FINDINGS: There is no evidence of fracture, dislocation. There is  a suprapatellar effusion. There are degenerative joint changes of left knee with narrowed sub joint space and osteophyte formation. Chondrocalcinosis is identified in the femoral tibial joint space. Soft tissues are unremarkable.  IMPRESSION: No acute fracture or dislocation. Degenerative joint changes of left knee.         I personally reviewed the radiologic studies     ED Course: * Patient was evaluated mostly for a fever of unknown etiology. She does have some mild hypoxia and has some mediastinal findings on her chest x-ray but no obvious infiltrate. Patient's knee is tender but does not appear to have a significant joint infection at this time. The source for the fever at this time remains unknown and according to the family's she's had difficulty with the joint pain for last  couple of days. She is currently not on any significant pain medication. She was given IV morphine here in emergency department.    Assessment: * Fever or unknown etiology Joint inflammation  Final Clinical Impression:  Final diagnoses:  Shoulder pain, acute, right  Fever of unknown origin     Plan:  Patient's case was reviewed with the hospitalist team, further disposition and management is per their evaluation.           Jennye Moccasin, MD 07/04/15 1322

## 2015-07-05 LAB — GLUCOSE, CAPILLARY: Glucose-Capillary: 117 mg/dL — ABNORMAL HIGH (ref 65–99)

## 2015-07-05 LAB — CBC
HCT: 33.5 % — ABNORMAL LOW (ref 35.0–47.0)
HEMOGLOBIN: 11.2 g/dL — AB (ref 12.0–16.0)
MCH: 30.6 pg (ref 26.0–34.0)
MCHC: 33.3 g/dL (ref 32.0–36.0)
MCV: 91.8 fL (ref 80.0–100.0)
PLATELETS: 192 10*3/uL (ref 150–440)
RBC: 3.65 MIL/uL — AB (ref 3.80–5.20)
RDW: 14.3 % (ref 11.5–14.5)
WBC: 10.5 10*3/uL (ref 3.6–11.0)

## 2015-07-05 LAB — BASIC METABOLIC PANEL
ANION GAP: 8 (ref 5–15)
BUN: 20 mg/dL (ref 6–20)
CHLORIDE: 102 mmol/L (ref 101–111)
CO2: 26 mmol/L (ref 22–32)
CREATININE: 0.94 mg/dL (ref 0.44–1.00)
Calcium: 8.5 mg/dL — ABNORMAL LOW (ref 8.9–10.3)
GFR calc non Af Amer: 54 mL/min — ABNORMAL LOW (ref >60)
Glucose, Bld: 124 mg/dL — ABNORMAL HIGH (ref 65–99)
POTASSIUM: 3.7 mmol/L (ref 3.5–5.1)
SODIUM: 136 mmol/L (ref 135–145)

## 2015-07-05 LAB — URINE CULTURE: CULTURE: NO GROWTH

## 2015-07-05 LAB — C-REACTIVE PROTEIN: CRP: 23.6 mg/dL — AB (ref <1.0)

## 2015-07-05 MED ORDER — TRIAMCINOLONE ACETONIDE 40 MG/ML IJ SUSP
40.0000 mg | Freq: Once | INTRAMUSCULAR | Status: AC
  Administered 2015-07-05: 40 mg via INTRA_ARTICULAR
  Filled 2015-07-05: qty 1

## 2015-07-05 NOTE — Evaluation (Signed)
Physical Therapy Evaluation Patient Details Name: Stephanie Duarte MRN: [id number] DOB: 05/10/1928 Today's Date: 07/05/2015   History of Present Illness  Pt is a 82 y.o. F admitted to hospital for pain in L knee and R shoulder. Per pts family, pt has not been able to ambulate well in past couple of days d/t knee pain. Pt received injection on 4/26. Pt has hx of dementia, HTN, hyperlipidemia, and asthma. [facility].  Clinical Impression  Pt is a pleasant 82 y.o. F [facility] L knee and R shoulder pain. Prior to admission, pt required assist with ADLs and used RW for ambulation. Pt confused regarding orientation during evaluation. Pt has dementia at baseline. Pts son present to confirm pts PLOF. Pt able to perform bed mobility with 2+ max assist. Pt able to transfer with RW and 2+ max assist. Pt able to ambulate approx 3 ft by side-stepping at EOB. Pt anxious during ambulation about falling. Pt demonstrated labored breathing, however O2 at 93% after ambulation. Pt able to perform supine there-ex, requiring max assist for L LE. Pt demonstrates deficits in strength, ROM, balance and mobility. Pt would benefit from further skilled PT to address deficits and return to PLOF; recommend pt sent to SNF after discharge from acute hospitalization.     Follow Up Recommendations SNF    Equipment Recommendations       Recommendations for Other Services       Precautions / Restrictions Precautions Precautions: Fall Restrictions Weight Bearing Restrictions: No      Mobility  Bed Mobility Overal bed mobility: Needs Assistance;+2 for physical assistance Bed Mobility: Supine to Sit     Supine to sit: +2 for physical assistance;Max assist     General bed mobility comments: Pt able to perform bed mobility with 2+ max assist. Pt required assist moving B LE and to upright trunk. Pt demonstrated guarding of L LE during mobility. Pt provided heavy cues regarding  hand placement.   Transfers Overall transfer level: Needs assistance Equipment used: Rolling walker (2 wheeled) Transfers: Sit to/from Stand Sit to Stand: Max assist;+2 physical assistance         General transfer comment: Pt able to transfer from EOB using RW and 2+ max assist. Pt provided heavy cues regarding hand and foot placement prior to transfer. Pt required increased time to maintain stand and upright posture. Pt demonstrates decreased WB on L LE.   Ambulation/Gait Ambulation/Gait assistance: Max assist;+2 physical assistance Ambulation Distance (Feet): 3 Feet Assistive device: Rolling walker (2 wheeled) Gait Pattern/deviations: Step-to pattern;Shuffle Gait velocity: slow   General Gait Details: Pt able to ambulate approx 3 ft taking side steps at EOB with RW and 2+ max assist. Pt required heavy verbal and tactile cues for weight shift and moving B LE. Pt demonstrates shuffle gait with short steps and decreased WB on L LE. Pt demonstrates posterior lean during gait, instructed to lean forward and upright posture. Ambulation limited by pts fatigue and weakness.   Stairs            Wheelchair Mobility    Modified Rankin (Stroke Patients Only)       Balance Overall balance assessment: Needs assistance Sitting-balance support: Bilateral upper extremity supported Sitting balance-Leahy Scale: Poor Sitting balance - Comments: Pt unable to maintain sitting balance on EOB without help from PT to upright trunk. Pt demonstrates poor postural control.    Standing balance support: Bilateral upper extremity supported Standing balance-Leahy  Scale: Poor Standing balance comment: Pt demonstrates posterior lean while standing with RW and 2+ max assist.                              Pertinent Vitals/Pain Pain Assessment: Faces Faces Pain Scale: Hurts even more Pain Location: L knee, headache Pain Descriptors / Indicators: Aching Pain Intervention(s): Limited  activity within patient's tolerance    Home Living Family/patient expects to be discharged to:: Assisted living               Home Equipment: Walker - 2 wheels Additional Comments: Pt lives at [facility]. Pt provided assist with ADLs.     Prior Function Level of Independence: Needs assistance   Gait / Transfers Assistance Needed: Pt able to ambulate using RW.   ADL's / Homemaking Assistance Needed: Pt requires assist with ADLs         Hand Dominance        Extremity/Trunk Assessment   Upper Extremity Assessment: RUE deficits/detail;LUE deficits/detail RUE Deficits / Details: R UE grossly 4-/5, limited shoulder flex ROM     LUE Deficits / Details: L UE grossly 4-/5, limited shoulder flex ROM   Lower Extremity Assessment: RLE deficits/detail;LLE deficits/detail RLE Deficits / Details: R LE grossly 4/5 strength LLE Deficits / Details: L LE grossly 2/5 strength, limited ROM d/t pain.      Communication   Communication: No difficulties  Cognition Arousal/Alertness: Awake/alert Behavior During Therapy: Anxious Overall Cognitive Status: History of cognitive impairments - at baseline       Memory: Decreased short-term memory              General Comments      Exercises Other Exercises Other Exercises: Pt performed supine ther-ex including B SLR, hip ab/ad, ankle pumps, and heel slides. Pt required min assist with R LE and max assist w/L LE. All ther-ex performed x10 reps, except for SLR on L side d/t pain.       Assessment/Plan    PT Assessment Patient needs continued PT services  PT Diagnosis Difficulty walking;Abnormality of gait;Generalized weakness;Acute pain   PT Problem List Decreased strength;Decreased range of motion;Decreased activity tolerance;Decreased balance;Decreased mobility  PT Treatment Interventions Gait training;Therapeutic exercise;Therapeutic activities;Balance training   PT Goals (Current goals can be found in the Care Plan  section) Acute Rehab PT Goals Patient Stated Goal: to return to PLOF PT Goal Formulation: Patient unable to participate in goal setting Time For Goal Achievement: 07/19/15 Potential to Achieve Goals: Fair    Frequency Min 2X/week   Barriers to discharge Decreased caregiver support Pt currently requires assist with all mobility.     Co-evaluation               End of Session Equipment Utilized During Treatment: Gait belt Activity Tolerance: Patient limited by fatigue Patient left: in bed;with bed alarm set;with family/visitor present;with call bell/phone within reach Nurse Communication: Mobility status         Time: 1610-9604 PT Time Calculation (min) (ACUTE ONLY): 37 min   Charges:         PT G Codes:        Dorita Fray 2015/07/31, 5:29 PM M. Hettie Holstein, SPT

## 2015-07-05 NOTE — Op Note (Signed)
Patient was seen at bedside. After patient identification and timeout procedure completed, the left knee was prepped with superolateral aspect of the knee. A 25-gauge needle inserted and 40 mg of: 1 g injected into the knee needle withdrawn and Band-Aid applied. The patient tolerated the procedure well  Preprocedure diagnosis is pseudogout attack left knee. Same and procedure: left knee injection

## 2015-07-05 NOTE — Progress Notes (Signed)
[facility] [place] [facility]   PATIENT NAME: Stephanie Duarte    MR#:  [id number]  DATE OF BIRTH:  01/14/1929  SUBJECTIVE:   Patient has dementia. Family is at bedside. She is here with left knee pain. REVIEW OF SYSTEMS:    Review of Systems  Unable to perform ROS: dementia    Tolerating Diet:yes      DRUG ALLERGIES:  No Known Allergies  VITALS:  Blood pressure 119/60, pulse 86, temperature 98.2 F (36.8 C), temperature source Oral, resp. rate 16, height 5\' 1"  (1.549 m), weight 71.215 kg (157 lb), SpO2 93 %.  PHYSICAL EXAMINATION:   Physical Exam  Constitutional: She is well-developed, well-nourished, and in no distress. No distress.  HENT:  Head: Normocephalic.  Eyes: No scleral icterus.  Neck: Normal range of motion. Neck supple. No JVD present. No tracheal deviation present.  Cardiovascular: Normal rate, regular rhythm and normal heart sounds.  Exam reveals no gallop and no friction rub.   No murmur heard. Pulmonary/Chest: Effort normal and breath sounds normal. No respiratory distress. She has no wheezes. She has no rales. She exhibits no tenderness.  Abdominal: Soft. Bowel sounds are normal. She exhibits no distension and no mass. There is no tenderness. There is no rebound and no guarding.  Musculoskeletal: Normal range of motion. She exhibits no edema.  Left knee with swelling  Neurological: She is alert.  Skin: Skin is warm. No rash noted. No erythema.  Psychiatric: Mood and affect normal.      LABORATORY PANEL:   CBC  Recent Labs Lab 07/05/15 0424  WBC 10.5  HGB 11.2*  HCT 33.5*  PLT 192   ------------------------------------------------------------------------------------------------------------------  Chemistries   Recent Labs Lab 07/04/15 0827 07/05/15 0424  NA 139 136  K 4.0 3.7  CL 103 102  CO2 25 26  GLUCOSE 161* 124*  BUN 14 20  CREATININE 0.87 0.94  CALCIUM 9.2 8.5*  AST 29  --   ALT 30  --   ALKPHOS 78   --   BILITOT 1.5*  --    ------------------------------------------------------------------------------------------------------------------  Cardiac Enzymes  Recent Labs Lab 07/04/15 0827  TROPONINI <0.03   ------------------------------------------------------------------------------------------------------------------  RADIOLOGY:  Dg Chest 2 View  07/04/2015  CLINICAL DATA:  Shortness of breath.  Diffuse pain. EXAM: CHEST  2 VIEW COMPARISON:  10/21/2012 FINDINGS: The cardiac silhouette is upper limits of normal in size. There is increased density in the upper mediastinum to the right of the trachea which is more prominent than on the prior study though not completely new, in there is mild leftward deviation of the trachea at the thoracic inlet. Atherosclerotic calcification and tortuosity are again seen of the thoracic aorta. No airspace consolidation, edema, pleural effusion, pneumothorax is identified. No acute osseous abnormality is seen. IMPRESSION: 1. No evidence of acute cardiopulmonary process. 2. Increased right paratracheal density with mild mass effect on the trachea at the thoracic inlet, possibly reflecting thyroid enlargement although other mediastinal mass and vascular compression are also considerations. Consider further evaluation with outpatient chest CT. Electronically Signed   By: Sebastian AcheAllen  Grady M.D.   On: 07/04/2015 09:35   Dg Shoulder Right  07/04/2015  CLINICAL DATA:  All over pain of right shoulder.  No injury. EXAM: RIGHT SHOULDER - 2+ VIEW COMPARISON:  None. FINDINGS: There is no evidence of fracture or dislocation. There are degenerative joint changes with narrowed joint of the right shoulder. Chronic posttraumatic deformity is identified in one of the lower right ribs.  Soft tissues are unremarkable. IMPRESSION: No acute fracture or dislocation. Degenerative joint changes of right shoulder. Electronically Signed   By: Sherian Rein M.D.   On: 07/04/2015 09:35   US  Venous Img Lower Unilateral Left  07/04/2015  CLINICAL DATA:  Acute left knee pain. EXAM: Left LOWER EXTREMITY VENOUS DOPPLER ULTRASOUND TECHNIQUE: Gray-scale sonography with graded compression, as well as color Doppler and duplex ultrasound were performed to evaluate the lower extremity deep venous systems from the level of the common femoral vein and including the common femoral, femoral, profunda femoral, popliteal and calf veins including the posterior tibial, peroneal and gastrocnemius veins when visible. The superficial great saphenous vein was also interrogated. Spectral Doppler was utilized to evaluate flow at rest and with distal augmentation maneuvers in the common femoral, femoral and popliteal veins. COMPARISON:  None. FINDINGS: Contralateral Common Femoral Vein: Respiratory phasicity is normal and symmetric with the symptomatic side. No evidence of thrombus. Normal compressibility. Common Femoral Vein: No evidence of thrombus. Normal compressibility, respiratory phasicity and response to augmentation. Saphenofemoral Junction: No evidence of thrombus. Normal compressibility and flow on color Doppler imaging. Profunda Femoral Vein: No evidence of thrombus. Normal compressibility and flow on color Doppler imaging. Femoral Vein: No evidence of thrombus. Normal compressibility, respiratory phasicity and response to augmentation. Popliteal Vein: No evidence of thrombus. Normal compressibility, respiratory phasicity and response to augmentation. Calf Veins: No evidence of thrombus. Normal compressibility and flow on color Doppler imaging. Superficial Great Saphenous Vein: No evidence of thrombus. Normal compressibility and flow on color Doppler imaging. Venous Reflux:  None. Other Findings: Complex Baker cyst is seen in left popliteal fossa measuring 5.7 cm. IMPRESSION: No evidence of deep venous thrombosis seen in left lower extremity. 5.7 cm complex Baker's cyst seen in left popliteal fossa. Electronically  Signed   By: Lupita Raider, M.D.   On: 07/04/2015 15:10   Dg Knee Complete 4 Views Left  07/04/2015  CLINICAL DATA:  Pain to the left knee with no injury. EXAM: LEFT KNEE - COMPLETE 4+ VIEW COMPARISON:  None. FINDINGS: There is no evidence of fracture, dislocation. There is a suprapatellar effusion. There are degenerative joint changes of left knee with narrowed sub joint space and osteophyte formation. Chondrocalcinosis is identified in the femoral tibial joint space. Soft tissues are unremarkable. IMPRESSION: No acute fracture or dislocation. Degenerative joint changes of left knee. Electronically Signed   By: Sherian Rein M.D.   On: 07/04/2015 09:34     ASSESSMENT AND PLAN:    82 year old female with a history of dependent and hypertension who presented with severe left knee pain.  1. Left knee pain: This appears to be due to septic arthritis. Follow-up on final results of aspiration. Continue vancomycin. The patient has septic arthritis then will need to have arthroscopy. Appreciate orthopedic surgery consultation.  2. Dementia: Continue Aricept and Namenda.  3. Essential hypertension: Continue Norvasc. Blood pressure is acceptable.  4. Hyperlipidemia: Continue pravastatin.  Management plans discussed with the patient and she is in agreement.  CODE STATUS: DNR  TOTAL TIME TAKING CARE OF THIS PATIENT: 30 minutes.     POSSIBLE D/C 2-4 days, DEPENDING ON CLINICAL CONDITION.   Ezekial Arns M.D on 07/05/2015 at 11:36 AM  Between 7am to 6pm - Pager - [phone] After 6pm go to [email or web address] - Social research officer, government  [facility]  Office  [phone]  CC: Primary care physician; No primary care provider on file.  Note: This dictation was prepared with Dragon dictation  along with smaller phrase technology. Any transcriptional errors that result from this process are unintentional.

## 2015-07-05 NOTE — NC FL2 (Signed)
[place] MEDICAID FL2 LEVEL OF CARE SCREENING TOOL     IDENTIFICATION  Patient Name: Stephanie Duarte Birthdate: Sep 12, 1928 Sex: female Admission Date (Current Location): 07/04/2015  [facility] and [place] Number:  Chiropodist and Address:  [facility], [address] [place], [place] [address]      Provider Number: [id number]  Attending Physician Name and Address:  Adrian Saran, MD  Relative Name and Phone Number:       Current Level of Care: Hospital Recommended Level of Care: Skilled Nursing Facility Prior Approval Number:    Date Approved/Denied:   PASRR Number:    Discharge Plan: SNF    Current Diagnoses: Patient Active Problem List   Diagnosis Date Noted  . Fever 07/04/2015    Orientation RESPIRATION BLADDER Height & Weight     Self  Normal Incontinent Weight: 157 lb (71.215 kg) Height:   (154.9 cm)  BEHAVIORAL SYMPTOMS/MOOD NEUROLOGICAL BOWEL NUTRITION STATUS   (not able to ambulate currently)  (none) Incontinent Diet (heart healthy)  AMBULATORY STATUS COMMUNICATION OF NEEDS Skin   Extensive Assist Verbally Normal                       Personal Care Assistance Level of Assistance  Bathing, Feeding, Dressing Bathing Assistance: Limited assistance Feeding assistance: Limited assistance Dressing Assistance: Limited assistance     Functional Limitations Info             SPECIAL CARE FACTORS FREQUENCY  PT (By licensed PT)                    Contractures Contractures Info: Not present    Additional Factors Info  Code Status, Allergies Code Status Info: DNR Allergies Info: NKA           Current Medications (07/05/2015):  This is the current hospital active medication list Current Facility-Administered Medications  Medication Dose Route Frequency Provider Last Rate Last Dose  . 0.9 %  sodium chloride infusion   Intravenous Continuous Katha Hamming, MD 75 mL/hr at 07/05/15 0836    .  acetaminophen (TYLENOL) tablet 650 mg  650 mg Oral Q6H PRN Katha Hamming, MD       Or  . acetaminophen (TYLENOL) suppository 650 mg  650 mg Rectal Q6H PRN Katha Hamming, MD      . acetaminophen (TYLENOL) tablet 650 mg  650 mg Oral TID PRN Katha Hamming, MD      . albuterol (PROVENTIL) (2.5 MG/3ML) 0.083% nebulizer solution 2.5 mg  2.5 mg Nebulization Q6H PRN Katha Hamming, MD      . albuterol (PROVENTIL) (2.5 MG/3ML) 0.083% nebulizer solution 2.5 mg  2.5 mg Nebulization BID Katha Hamming, MD   2.5 mg at 07/05/15 0851  . amLODipine (NORVASC) tablet 2.5 mg  2.5 mg Oral Daily Katha Hamming, MD   2.5 mg at 07/05/15 1003  . aspirin chewable tablet 81 mg  81 mg Oral Daily Katha Hamming, MD   81 mg at 07/05/15 1003  . bisacodyl (DULCOLAX) EC tablet 5 mg  5 mg Oral Daily PRN Katha Hamming, MD      . citalopram (CELEXA) tablet 20 mg  20 mg Oral Daily Katha Hamming, MD   20 mg at 07/05/15 1003  . docusate sodium (COLACE) capsule 100 mg  100 mg Oral BID Katha Hamming, MD   100 mg at 07/05/15 1003  . donepezil (ARICEPT) tablet 5 mg  5 mg Oral QHS Katha Hamming,  MD   5 mg at 07/04/15 2303  . enoxaparin (LOVENOX) injection 40 mg  40 mg Subcutaneous Q24H Katha HammingSnehalatha Konidena, MD   40 mg at 07/04/15 1655  . HYDROcodone-acetaminophen (NORCO/VICODIN) 5-325 MG per tablet 1-2 tablet  1-2 tablet Oral Q4H PRN Katha HammingSnehalatha Konidena, MD   1 tablet at 07/04/15 1654  . memantine (NAMENDA) tablet 5 mg  5 mg Oral BID Katha HammingSnehalatha Konidena, MD   5 mg at 07/05/15 1003  . ondansetron (ZOFRAN) tablet 4 mg  4 mg Oral Q6H PRN Katha HammingSnehalatha Konidena, MD       Or  . ondansetron (ZOFRAN) injection 4 mg  4 mg Intravenous Q6H PRN Katha HammingSnehalatha Konidena, MD      . pravastatin (PRAVACHOL) tablet 40 mg  40 mg Oral Daily Katha HammingSnehalatha Konidena, MD   40 mg at 07/05/15 1004  . traZODone (DESYREL) tablet 25 mg  25 mg Oral QHS PRN Katha HammingSnehalatha Konidena, MD      . triamcinolone acetonide  (KENALOG-40) injection 40 mg  40 mg Intra-articular Once Kennedy BuckerMichael Menz, MD      . vancomycin (VANCOCIN) IVPB 750 mg/150 ml premix  750 mg Intravenous Q18H Katha HammingSnehalatha Konidena, MD   750 mg at 07/05/15 0026     Discharge Medications: Please see discharge summary for a list of discharge medications.  Relevant Imaging Results:  Relevant Lab Results:   Additional Information SS: [id number]  York SpanielMonica Donella Pascarella, LCSW

## 2015-07-05 NOTE — Progress Notes (Signed)
Pt to transfer, [facility].  Daughter Mitzi at bedside and aware of transfer.

## 2015-07-05 NOTE — Clinical Social Work Note (Signed)
Clinical Social Work Assessment  Patient Details  Name: Stephanie Duarte MRN: [id number] Date of Birth: 11/18/1928  Date of referral:  07/05/15               Reason for consult:  Facility Placement                Permission sought to share information with:  Magazine features editoracility Contact Representative (patient with advanced dementia) Permission granted to share information::     Name::        Agency::     Relationship::     Contact Information:     Housing/Transportation Living arrangements for the past 2 months:  Assisted Living Facility Source of Information:  Adult Children Patient Interpreter Needed:  None Criminal Activity/Legal Involvement Pertinent to Current Situation/Hospitalization:  No - Comment as needed Significant Relationships:  Adult Children Lives with:  Facility Resident Do you feel safe going back to the place where you live?  Yes Need for family participation in patient care:  Yes (Comment)  Care giving concerns:  [facility] and has been there for a year and 1/2.    Social Worker assessment / plan:  CSW unable to obtain information from patient as she has advanced dementia however, patient's daughter: Stephanie Duarte: [phone] was in patient's room today and she confirmed [facility]. She would like for her to return but realizes that she may need rehab at discharge due to patient's inability to ambulate. Stephanie Duarte is in agreement with initiating a bedsearch if needed. CSW will not change pasrr until is is known for sure that she will go to STR.   Employment status:  Retired Database administratornsurance information:  Managed Medicare PT Recommendations:  Not assessed at this time Information / Referral to community resources:     Patient/Family's Response to care:  Patient's daughter expressed appreciation for CSW assistance.  Patient/Family's Understanding of and Emotional Response to Diagnosis, Current Treatment, and Prognosis:  Patient's  daughter did express concern that patient is not at her baseline in regards to mobility.  Emotional Assessment Appearance:  Appears stated age Attitude/Demeanor/Rapport:   (quiet and calm) Affect (typically observed):  Calm Orientation:  Oriented to Self Alcohol / Substance use:  Not Applicable Psych involvement (Current and /or in the community):  No (Comment)  Discharge Needs  Concerns to be addressed:  Care Coordination Readmission within the last 30 days:  No Current discharge risk:  None Barriers to Discharge:  No Barriers Identified   York SpanielMonica Qadir Folks, LCSW 07/05/2015, 10:55 AM

## 2015-07-06 LAB — BASIC METABOLIC PANEL
ANION GAP: 5 (ref 5–15)
BUN: 19 mg/dL (ref 6–20)
CALCIUM: 8.4 mg/dL — AB (ref 8.9–10.3)
CO2: 28 mmol/L (ref 22–32)
Chloride: 106 mmol/L (ref 101–111)
Creatinine, Ser: 0.77 mg/dL (ref 0.44–1.00)
GFR calc Af Amer: >60 mL/min (ref >60)
Glucose, Bld: 119 mg/dL — ABNORMAL HIGH (ref 65–99)
POTASSIUM: 3.9 mmol/L (ref 3.5–5.1)
SODIUM: 139 mmol/L (ref 135–145)

## 2015-07-06 LAB — GLUCOSE, CAPILLARY: GLUCOSE-CAPILLARY: 105 mg/dL — AB (ref 65–99)

## 2015-07-06 LAB — CBC
HCT: 32.5 % — ABNORMAL LOW (ref 35.0–47.0)
Hemoglobin: 10.8 g/dL — ABNORMAL LOW (ref 12.0–16.0)
MCH: 30.5 pg (ref 26.0–34.0)
MCHC: 33.1 g/dL (ref 32.0–36.0)
MCV: 92.1 fL (ref 80.0–100.0)
PLATELETS: 208 10*3/uL (ref 150–440)
RBC: 3.53 MIL/uL — AB (ref 3.80–5.20)
RDW: 14 % (ref 11.5–14.5)
WBC: 8.8 10*3/uL (ref 3.6–11.0)

## 2015-07-06 NOTE — Progress Notes (Signed)
PT is recommending SNF. Clinical Education officer, museum (CSW) met with patient and her daughter Mitzi was at bedside. CSW presented bed offers. Daughter chose [facility]. SNF level PASARR has been received. CSW started [employer] authorization. CSW will continue to follow and assist as needed.   Blima Rich, LCSW [phone]

## 2015-07-06 NOTE — Progress Notes (Addendum)
[employer] [facility] [place] [facility]   PATIENT NAME: Stephanie Duarte    MR#:  [id number]  DATE OF BIRTH:  Feb 09, 1929  SUBJECTIVE:   Patient has dementia. Resting comfortably. Left knee pain is manageable . Daughter is at bedside.  Patient had left knee joint injection yesterday REVIEW OF SYSTEMS:    Review of Systems  Unable to perform ROS: dementia    Tolerating Diet:yes      DRUG ALLERGIES:  No Known Allergies  VITALS:  Blood pressure 138/64, pulse 82, temperature 98.3 F (36.8 C), temperature source Oral, resp. rate 19, height  (1.549 m), weight 77.429 kg (170 lb 11.2 oz), SpO2 94 %.  PHYSICAL EXAMINATION:   Physical Exam  Constitutional: She is well-developed, well-nourished, and in no distress. No distress.  HENT:  Head: Normocephalic.  Eyes: No scleral icterus.  Neck: Normal range of motion. Neck supple. No JVD present. No tracheal deviation present.  Cardiovascular: Normal rate, regular rhythm and normal heart sounds.  Exam reveals no gallop and no friction rub.   No murmur heard. Pulmonary/Chest: Effort normal and breath sounds normal. No respiratory distress. She has no wheezes. She has no rales. She exhibits no tenderness.  Abdominal: Soft. Bowel sounds are normal. She exhibits no distension and no mass. There is no tenderness. There is no rebound and no guarding.  Musculoskeletal: Normal range of motion. She exhibits no edema.  Left knee with decreased swelling, clear Band-Aid  Neurological: She is alert.  Skin: Skin is warm. No rash noted. No erythema.  Psychiatric: Mood and affect normal.      LABORATORY PANEL:   CBC  Recent Labs Lab 07/06/15 0356  WBC 8.8  HGB 10.8*  HCT 32.5*  PLT 208   ------------------------------------------------------------------------------------------------------------------  Chemistries   Recent Labs Lab 07/04/15 0827  07/06/15 0356  NA 139  < > 139  K 4.0  < > 3.9  CL 103  < > 106  CO2  25  < > 28  GLUCOSE 161*  < > 119*  BUN 14  < > 19  CREATININE 0.87  < > 0.77  CALCIUM 9.2  < > 8.4*  AST 29  --   --   ALT 30  --   --   ALKPHOS 78  --   --   BILITOT 1.5*  --   --   < > = values in this interval not displayed. ------------------------------------------------------------------------------------------------------------------  Cardiac Enzymes  Recent Labs Lab 07/04/15 0827  TROPONINI <0.03   ------------------------------------------------------------------------------------------------------------------  RADIOLOGY:  US Venous Img Lower Unilateral Left  07/04/2015  CLINICAL DATA:  Acute left knee pain. EXAM: Left LOWER EXTREMITY VENOUS DOPPLER ULTRASOUND TECHNIQUE: Gray-scale sonography with graded compression, as well as color Doppler and duplex ultrasound were performed to evaluate the lower extremity deep venous systems from the level of the common femoral vein and including the common femoral, femoral, profunda femoral, popliteal and calf veins including the posterior tibial, peroneal and gastrocnemius veins when visible. The superficial great saphenous vein was also interrogated. Spectral Doppler was utilized to evaluate flow at rest and with distal augmentation maneuvers in the common femoral, femoral and popliteal veins. COMPARISON:  None. FINDINGS: Contralateral Common Femoral Vein: Respiratory phasicity is normal and symmetric with the symptomatic side. No evidence of thrombus. Normal compressibility. Common Femoral Vein: No evidence of thrombus. Normal compressibility, respiratory phasicity and response to augmentation. Saphenofemoral Junction: No evidence of thrombus. Normal compressibility and flow on color Doppler imaging. Profunda Femoral Vein:  No evidence of thrombus. Normal compressibility and flow on color Doppler imaging. Femoral Vein: No evidence of thrombus. Normal compressibility, respiratory phasicity and response to augmentation. Popliteal Vein: No  evidence of thrombus. Normal compressibility, respiratory phasicity and response to augmentation. Calf Veins: No evidence of thrombus. Normal compressibility and flow on color Doppler imaging. Superficial Great Saphenous Vein: No evidence of thrombus. Normal compressibility and flow on color Doppler imaging. Venous Reflux:  None. Other Findings: Complex Baker cyst is seen in left popliteal fossa measuring 5.7 cm. IMPRESSION: No evidence of deep venous thrombosis seen in left lower extremity. 5.7 cm complex Baker's cyst seen in left popliteal fossa. Electronically Signed   By: Lupita RaiderJames  Green Jr, M.D.   On: 07/04/2015 15:10     ASSESSMENT AND PLAN:    82 year old female with a history of dependent and hypertension who presented with severe left knee pain.  1. Left knee pain: Secondary to pseudogout Status post joint aspiration, joint fluid culture is pending Status post joint injection by Dr. Rosita KeaMenz 07/04/2005 Physical therapy is recommending skilled nursing facility for rehabilitation Continue vancomycin. Appreciate orthopedic surgery consultation. Blood and urine cultures are negative so far  2. Dementia: Continue Aricept and Namenda.  3. Essential hypertension: Continue Norvasc. Blood pressure is acceptable.  4. Hyperlipidemia: Continue pravastatin.  Management plans discussed with the patient and patient's daughter ,are in agreement.  CODE STATUS: DNR  TOTAL TIME TAKING CARE OF THIS PATIENT: 32 minutes.     POSSIBLE D/C 2-4 days, DEPENDING ON CLINICAL CONDITION.   Ramonita LabGouru, Graciana Sessa M.D on 07/06/2015 at 2:07 PM  Between 7am to 6pm - Pager - [phone] After 6pm go to [email or web address] - Social research officer, governmentpassword [email or web address] [email or web address]  [facility] [place] [facility]  Office  [phone]  CC: Primary care physician; No primary care provider on file.  Note: This dictation was prepared with Dragon dictation along with smaller phrase technology. Any transcriptional errors that result from this process are  unintentional.

## 2015-07-06 NOTE — Clinical Social Work Placement (Signed)
   CLINICAL SOCIAL WORK PLACEMENT  NOTE  Date:  07/06/2015  Patient Details  Name: Stephanie Duarte A Persley MRN: [id number] Date of Birth: 10/31/1928  Clinical Social Work is seeking post-discharge placement for this patient at the Skilled  Nursing Facility level of care (*CSW will initial, date and re-position this form in  chart as items are completed):  Yes   Patient/family provided with [place] Clinical Social Work Department's list of facilities offering this level of care within the geographic area requested by the patient (or if unable, by the patient's family).  Yes   Patient/family informed of their freedom to choose among providers that offer the needed level of care, that participate in Medicare, Medicaid or managed care program needed by the patient, have an available bed and are willing to accept the patient.  Yes   Patient/family informed of [facility]'s ownership interest in [facility] and [facility], as well as of the fact that they are under no obligation to receive care at these facilities.  PASRR submitted to EDS on 07/06/15     PASRR number received on 07/06/15 ([id number])     Existing PASRR number confirmed on       FL2 transmitted to all facilities in geographic area requested by pt/family on 07/05/15     FL2 transmitted to all facilities within larger geographic area on       Patient informed that his/her managed care company has contracts with or will negotiate with certain facilities, including the following:            Patient/family informed of bed offers received.  Patient chooses bed at       Physician recommends and patient chooses bed at      Patient to be transferred to   on  .  Patient to be transferred to facility by       Patient family notified on   of transfer.  Name of family member notified:        PHYSICIAN       Additional Comment:    _______________________________________________ Haig ProphetMorgan, Eh Sauseda G, LCSW 07/06/2015, 8:47  AM

## 2015-07-06 NOTE — Progress Notes (Signed)
[facility] has been received. Auth # [id number] RVB. [facility] is aware of above. Clinical Child psychotherapistocial Worker (CSW) contacted patient's daughter Mitzi and left her a voicemail making her aware of above. CSW will continue to follow and assist as needed.   Jetta LoutBailey Morgan, LCSW [phone]

## 2015-07-06 NOTE — Plan of Care (Signed)
Problem: Education: Goal: Knowledge of [facility] Education information/materials will improve Outcome: Not Progressing Patient alert to self x1 education needs reinforcement.   Problem: Safety: Goal: Ability to remain free from injury will improve Outcome: Progressing Bed alarm intact and room near nursing station.

## 2015-07-06 NOTE — Care Management Important Message (Signed)
Important Message  Patient Details  Name: Stephanie Duarte MRN: [id number] Date of Birth: 07/03/1928   Medicare Important Message Given:  Yes    Olegario MessierKathy A Janie Capp 07/06/2015, 10:17 AM

## 2015-07-06 NOTE — Clinical Social Work Placement (Signed)
   CLINICAL SOCIAL WORK PLACEMENT  NOTE  Date:  07/06/2015  Patient Details  Name: Stephanie Duarte MRN: [id number] Date of Birth: 10/27/1928  Clinical Social Work is seeking post-discharge placement for this patient at the Skilled  Nursing Facility level of care (*CSW will initial, date and re-position this form in  chart as items are completed):  Yes   Patient/family provided with [place] Clinical Social Work Department's list of facilities offering this level of care within the geographic area requested by the patient (or if unable, by the patient's family).  Yes   Patient/family informed of their freedom to choose among providers that offer the needed level of care, that participate in Medicare, Medicaid or managed care program needed by the patient, have an available bed and are willing to accept the patient.  Yes   Patient/family informed of [facility]'s ownership interest in [facility] and [facility], as well as of the fact that they are under no obligation to receive care at these facilities.  PASRR submitted to EDS on 07/06/15     PASRR number received on 07/06/15 ([id number])     Existing PASRR number confirmed on       FL2 transmitted to all facilities in geographic area requested by pt/family on 07/05/15     FL2 transmitted to all facilities within larger geographic area on       Patient informed that his/her managed care company has contracts with or will negotiate with certain facilities, including the following:        Yes   Patient/family informed of bed offers received.  Patient chooses bed at  [facility] )     Physician recommends and patient chooses bed at      Patient to be transferred to   on  .  Patient to be transferred to facility by       Patient family notified on   of transfer.  Name of family member notified:        PHYSICIAN       Additional Comment:    _______________________________________________ Haig ProphetMorgan, [place] Mosco G,  LCSW 07/06/2015, 9:00 AM

## 2015-07-06 NOTE — Plan of Care (Signed)
Problem: Education: Goal: Knowledge of [facility] Education information/materials will improve Outcome: Not Progressing Patient is confused and no evidence or retention of learning.

## 2015-07-07 DIAGNOSIS — R202 Paresthesia of skin: Secondary | ICD-10-CM | POA: Diagnosis not present

## 2015-07-07 DIAGNOSIS — E785 Hyperlipidemia, unspecified: Secondary | ICD-10-CM | POA: Diagnosis not present

## 2015-07-07 DIAGNOSIS — R609 Edema, unspecified: Secondary | ICD-10-CM | POA: Diagnosis not present

## 2015-07-07 DIAGNOSIS — E559 Vitamin D deficiency, unspecified: Secondary | ICD-10-CM | POA: Diagnosis not present

## 2015-07-07 DIAGNOSIS — R413 Other amnesia: Secondary | ICD-10-CM | POA: Diagnosis not present

## 2015-07-07 DIAGNOSIS — M11262 Other chondrocalcinosis, left knee: Secondary | ICD-10-CM | POA: Diagnosis not present

## 2015-07-07 DIAGNOSIS — M11869 Other specified crystal arthropathies, unspecified knee: Secondary | ICD-10-CM | POA: Diagnosis not present

## 2015-07-07 DIAGNOSIS — F339 Major depressive disorder, recurrent, unspecified: Secondary | ICD-10-CM | POA: Diagnosis not present

## 2015-07-07 DIAGNOSIS — R600 Localized edema: Secondary | ICD-10-CM | POA: Diagnosis not present

## 2015-07-07 DIAGNOSIS — F0390 Unspecified dementia without behavioral disturbance: Secondary | ICD-10-CM | POA: Diagnosis not present

## 2015-07-07 DIAGNOSIS — Z7982 Long term (current) use of aspirin: Secondary | ICD-10-CM | POA: Diagnosis not present

## 2015-07-07 DIAGNOSIS — F39 Unspecified mood [affective] disorder: Secondary | ICD-10-CM | POA: Diagnosis not present

## 2015-07-07 DIAGNOSIS — S82009A Unspecified fracture of unspecified patella, initial encounter for closed fracture: Secondary | ICD-10-CM | POA: Diagnosis not present

## 2015-07-07 DIAGNOSIS — R262 Difficulty in walking, not elsewhere classified: Secondary | ICD-10-CM | POA: Diagnosis not present

## 2015-07-07 DIAGNOSIS — I10 Essential (primary) hypertension: Secondary | ICD-10-CM | POA: Diagnosis not present

## 2015-07-07 DIAGNOSIS — R531 Weakness: Secondary | ICD-10-CM | POA: Diagnosis not present

## 2015-07-07 LAB — VANCOMYCIN, TROUGH: Vancomycin Tr: 12 ug/mL (ref 10–20)

## 2015-07-07 LAB — GLUCOSE, CAPILLARY: Glucose-Capillary: 102 mg/dL — ABNORMAL HIGH (ref 65–99)

## 2015-07-07 MED ORDER — VANCOMYCIN HCL IN DEXTROSE 750-5 MG/150ML-% IV SOLN
750.0000 mg | INTRAVENOUS | Status: DC
  Filled 2015-07-07: qty 150

## 2015-07-07 MED ORDER — DOCUSATE SODIUM 100 MG PO CAPS: 100.0000 mg | ORAL_CAPSULE | Freq: Two times a day (BID) | ORAL | Status: DC

## 2015-07-07 MED ORDER — ACETAMINOPHEN 325 MG PO TABS: 650.0000 mg | ORAL_TABLET | Freq: Four times a day (QID) | ORAL | Status: DC | PRN

## 2015-07-07 MED ORDER — VANCOMYCIN HCL 500 MG IV SOLR: 500.0000 mg | INTRAVENOUS | Status: DC

## 2015-07-07 MED ORDER — HYDROCODONE-ACETAMINOPHEN 5-325 MG PO TABS: 1.0000 | ORAL_TABLET | ORAL | Status: DC | PRN

## 2015-07-07 MED ORDER — BISACODYL 5 MG PO TBEC: 5.0000 mg | DELAYED_RELEASE_TABLET | Freq: Every day | ORAL | Status: DC | PRN

## 2015-07-07 NOTE — Progress Notes (Signed)
Pharmacy Antibiotic Note  Stephanie Duarte is a 82 y.o. female admitted on 07/04/2015 with possible septic arthritis.  Pharmacy has been consulted for vancomycin dosing.  This is day #4 of vancomycin. Blood cultures have no growth to date. Synovial fluid culture is pending. Per discussion with MD, will continue vancomycin until synovial fluid culture finalizes.   Plan: Vancomycin trough drawn at 0331 this morning was 12 mcg/mL on a regimen of vancomycin 750 mg IV q18h. While this is slightly below target trough of 15-20 mcg/mL - will continue with this dose as patient is ~50% > than her ideal body weight and will likely accumulate.   Continue with vancomycin 750 mg IV q18h. Will recheck trough on 5/1 @ 1130.  Height: 5\' 1"  (154.9 cm) Weight: 164 lb 3.2 oz (74.481 kg) IBW/kg (Calculated) : 47.8  Temp (24hrs), Avg:98.6 F (37 C), Min:98.3 F (36.8 C), Max:99.3 F (37.4 C)   Recent Labs Lab 07/04/15 0827 07/05/15 0424 07/06/15 0356 07/07/15 0331  WBC 11.6* 10.5 8.8  --   CREATININE 0.87 0.94 0.77  --   VANCOTROUGH  --   --   --  12    Estimated Creatinine Clearance: 47.5 mL/min (by C-G formula based on Cr of 0.77).    No Known Allergies  Antimicrobials this admission: 4/25 vancomycin >>   Dose adjustments this admission: 4/28 VT 12 mcg/mL - continue 750 q18h regimen  Microbiology results: 4/25 BCx: no growth 3 days 4/25 UCx: No growth final  4/25 Synovial: Pending  4/25 MRSA PCR: negative  Thank you for allowing pharmacy to be a part of this patient's care.  Cindi CarbonMary M Oriah Leinweber, PharmD Clinical Pharmacist 07/07/2015 11:13 AM

## 2015-07-07 NOTE — Clinical Social Work Placement (Signed)
   CLINICAL SOCIAL WORK PLACEMENT  NOTE  Date:  07/07/2015  Patient Details  Name: Stephanie Duarte MRN: [id number] Date of Birth: 01/03/1929  Clinical Social Work is seeking post-discharge placement for this patient at the Skilled  Nursing Facility level of care (*CSW will initial, date and re-position this form in  chart as items are completed):  Yes   Patient/family provided with [place] Clinical Social Work Department's list of facilities offering this level of care within the geographic area requested by the patient (or if unable, by the patient's family).  Yes   Patient/family informed of their freedom to choose among providers that offer the needed level of care, that participate in Medicare, Medicaid or managed care program needed by the patient, have an available bed and are willing to accept the patient.  Yes   Patient/family informed of [facility]'s ownership interest in [facility] and [facility], as well as of the fact that they are under no obligation to receive care at these facilities.  PASRR submitted to EDS on 07/06/15     PASRR number received on 07/06/15 ([id number])     Existing PASRR number confirmed on       FL2 transmitted to all facilities in geographic area requested by pt/family on 07/05/15     FL2 transmitted to all facilities within larger geographic area on       Patient informed that his/her managed care company has contracts with or will negotiate with certain facilities, including the following:        Yes   Patient/family informed of bed offers received.  Patient chooses bed at  [facility]     Physician recommends and patient chooses bed at      Patient to be transferred to  [facility] ) on 07/07/15.  Patient to be transferred to facility by  [facility]     Patient family notified on 07/07/15 of transfer.  Name of family member notified:   (Patient's daughter Marin CommentMitzi is aware of D/C today. )      PHYSICIAN       Additional Comment:    _______________________________________________ Haig ProphetMorgan, Deborra Phegley G, LCSW 07/07/2015, 1:25 PM

## 2015-07-07 NOTE — Discharge Summary (Signed)
[facility] [employer] at [facility]   PATIENT NAME: Stephanie Duarte    MR#:  [id number]  DATE OF BIRTH:  August 24, 1928  DATE OF ADMISSION:  07/04/2015 ADMITTING PHYSICIAN: Katha Hamming, MD  DATE OF DISCHARGE: 07/07/2015 PRIMARY CARE PHYSICIAN: No primary care provider on file.    ADMISSION DIAGNOSIS:  Fever of unknown origin [R50.9] Left knee pain [M25.562] Shoulder pain, acute, right [M25.511]  DISCHARGE DIAGNOSIS:    SECONDARY DIAGNOSIS:   Past Medical History  Diagnosis Date  . Dementia   . Hypertension   . Hyperlipidemia   . Asthma     HOSPITAL COURSE:   82 year old female with a history of dependent and hypertension who presented with severe left knee pain.  1. Left knee pain: Secondary to pseudogout Status post joint aspiration, joint fluid culture is pending, PCP  needs to follow up on the pending culture. Okay to discharge patient from [facility] no need of IV or by mouth antibiotics Status post steroid joint injection by Dr. Rosita Kea 07/04/2005 Physical therapy is recommending skilled nursing facility for rehabilitation Provided vancomycin during hospital course will discontinue that Appreciate orthopedic surgery consultation. Blood and urine cultures are negative so far  2. Dementia: Continue Aricept and Namenda. According to the daughter who is at bedside patient is at her baseline  3. Essential hypertension: Continue Norvasc. Blood pressure is stable  4. Hyperlipidemia: Continue pravastatin.  Disposition skilled nursing care for rehabilitation DISCHARGE CONDITIONS:   Fair  CONSULTS OBTAINED:  Treatment Team:  Kennedy Bucker, MD   PROCEDURES steroid injection into left knee  DRUG ALLERGIES:  No Known Allergies  DISCHARGE MEDICATIONS:   Current Discharge Medication List    START taking these medications   Details  bisacodyl (DULCOLAX) 5 MG EC tablet Take 1 tablet (5 mg total) by mouth daily as needed for moderate  constipation. Qty: 30 tablet, Refills: 0    docusate sodium (COLACE) 100 MG capsule Take 1 capsule (100 mg total) by mouth 2 (two) times daily. Qty: 10 capsule, Refills: 0    HYDROcodone-acetaminophen (NORCO/VICODIN) 5-325 MG tablet Take 1-2 tablets by mouth every 4 (four) hours as needed for moderate pain. Qty: 30 tablet, Refills: 0      CONTINUE these medications which have CHANGED   Details  acetaminophen (TYLENOL) 325 MG tablet Take 2 tablets (650 mg total) by mouth every 6 (six) hours as needed for mild pain.      CONTINUE these medications which have NOT CHANGED   Details  albuterol (ACCUNEB) 0.63 MG/3ML nebulizer solution Take 1 ampule by nebulization 2 (two) times daily. Pt also uses every six hours as needed for wheezing.    albuterol (PROVENTIL HFA;VENTOLIN HFA) 108 (90 Base) MCG/ACT inhaler Inhale 2 puffs into the lungs every 6 (six) hours as needed for wheezing or shortness of breath (and/or cough).    amLODipine (NORVASC) 2.5 MG tablet Take 2.5 mg by mouth daily.    aspirin 81 MG chewable tablet Chew 81 mg by mouth daily.    cholecalciferol (VITAMIN D) 1000 units tablet Take 4,000 Units by mouth daily.    citalopram (CELEXA) 20 MG tablet Take 20 mg by mouth daily.    donepezil (ARICEPT) 5 MG tablet Take 5 mg by mouth at bedtime.    memantine (NAMENDA) 5 MG tablet Take 5 mg by mouth 2 (two) times daily.    pravastatin (PRAVACHOL) 40 MG tablet Take 40 mg by mouth daily.         DISCHARGE INSTRUCTIONS:  Activity as recommended by physical therapy Follow-up with primary care physician at the facility in 2-3 days. Joint fluid cultures are pending PCP to follow up on that  DIET:  Low salt diet   DISCHARGE CONDITION:  Fair  ACTIVITY:  Activity as tolerated per PT recommendations  OXYGEN:  Home Oxygen: No.   Oxygen Delivery: room air  DISCHARGE LOCATION:  nursing home   If you experience worsening of your admission symptoms, develop shortness of  breath, life threatening emergency, suicidal or homicidal thoughts you must seek medical attention immediately by calling 911 or calling your MD immediately  if symptoms less severe.  You Must read complete instructions/literature along with all the possible adverse reactions/side effects for all the Medicines you take and that have been prescribed to you. Take any new Medicines after you have completely understood and accpet all the possible adverse reactions/side effects.   Please note  You were cared for by a hospitalist during your hospital stay. If you have any questions about your discharge medications or the care you received while you were in the hospital after you are discharged, you can call the unit and asked to speak with the hospitalist on call if the hospitalist that took care of you is not available. Once you are discharged, your primary care physician will handle any further medical issues. Please note that NO REFILLS for any discharge medications will be authorized once you are discharged, as it is imperative that you return to your primary care physician (or establish a relationship with a primary care physician if you do not have one) for your aftercare needs so that they can reassess your need for medications and monitor your lab values.     Today  Chief Complaint  Patient presents with  . Generalized Body Aches  . Knee Pain   Patient is resting comfortably. Daughter is at bedside. Patient is demented and according to the daughter patient is at her baseline.  ROS: Patient is demented   VITAL SIGNS:  Blood pressure 134/63, pulse 81, temperature 98.5 F (36.9 C), temperature source Oral, resp. rate 18, height 5\' 1"  (1.549 m), weight 74.481 kg (164 lb 3.2 oz), SpO2 99 %.  I/O:    Intake/Output Summary (Last 24 hours) at 07/07/15 1259 Last data filed at 07/07/15 0400  Gross per 24 hour  Intake 1307.5 ml  Output      0 ml  Net 1307.5 ml    PHYSICAL EXAMINATION:   GENERAL:  82 y.o.-year-old patient lying in the bed with no acute distress.  EYES: Pupils equal, round, reactive to light and accommodation. No scleral icterus. Extraocular muscles intact.  HEENT: Head atraumatic, normocephalic. Oropharynx and nasopharynx clear.  NECK:  Supple, no jugular venous distention. No thyroid enlargement, no tenderness.  LUNGS: Normal breath sounds bilaterally, no wheezing, rales,rhonchi or crepitation. No use of accessory muscles of respiration.  CARDIOVASCULAR: S1, S2 normal. No murmurs, rubs, or gallops.  ABDOMEN: Soft, non-tender, non-distended. Bowel sounds present. No organomegaly or mass.  EXTREMITIES:Left knee with decreased , edema and tenderness ,clear Band-Aid  No pedal edema, cyanosis, or clubbing.  NEUROLOGIC: Cranial nerves II through XII are  grossly intact. Muscle strength at her baseline. Sensation seems to be  intact. Gait not checked.  PSYCHIATRIC: The patient is alert and  demented SKIN: No obvious rash, lesion, or ulcer.   DATA REVIEW:   CBC  Recent Labs Lab 07/06/15 0356  WBC 8.8  HGB 10.8*  HCT 32.5*  PLT 208  Chemistries   Recent Labs Lab 07/04/15 0827  07/06/15 0356  NA 139  < > 139  K 4.0  < > 3.9  CL 103  < > 106  CO2 25  < > 28  GLUCOSE 161*  < > 119*  BUN 14  < > 19  CREATININE 0.87  < > 0.77  CALCIUM 9.2  < > 8.4*  AST 29  --   --   ALT 30  --   --   ALKPHOS 78  --   --   BILITOT 1.5*  --   --   < > = values in this interval not displayed.  Cardiac Enzymes  Recent Labs Lab 07/04/15 0827  TROPONINI <0.03    Microbiology Results  Results for orders placed or performed during the hospital encounter of 07/04/15  Urine culture     Status: None   Collection Time: 07/04/15  8:27 AM  Result Value Ref Range Status   Specimen Description URINE, CATHETERIZED  Final   Special Requests NONE  Final   Culture NO GROWTH 1 DAY  Final   Report Status 07/05/2015 FINAL  Final  Culture, blood (Routine X 2) w Reflex  to ID Panel     Status: None (Preliminary result)   Collection Time: 07/04/15 10:03 AM  Result Value Ref Range Status   Specimen Description BLOOD RIGHT ARM  Final   Special Requests   Final    BOTTLES DRAWN AEROBIC AND ANAEROBIC AER ANA   Culture NO GROWTH 3 DAYS  Final   Report Status PENDING  Incomplete  Rapid Influenza A&B Antigens ([facility] only)     Status: None   Collection Time: 07/04/15 10:03 AM  Result Value Ref Range Status   Influenza A ([facility]) NEGATIVE NEGATIVE Final   Influenza B ([facility]) NEGATIVE NEGATIVE Final  Culture, blood (Routine X 2) w Reflex to ID Panel     Status: None (Preliminary result)   Collection Time: 07/04/15 10:15 AM  Result Value Ref Range Status   Specimen Description BLOOD LEFT WRIST  Final   Special Requests   Final    BOTTLES DRAWN AEROBIC AND ANAEROBIC AER ANA   Culture NO GROWTH 3 DAYS  Final   Report Status PENDING  Incomplete  MRSA PCR Screening     Status: None   Collection Time: 07/04/15  5:14 PM  Result Value Ref Range Status   MRSA by PCR NEGATIVE NEGATIVE Final    Comment:        The GeneXpert MRSA Assay (FDA approved for NASAL specimens only), is one component of a comprehensive MRSA colonization surveillance program. It is not intended to diagnose MRSA infection nor to guide or monitor treatment for MRSA infections.   Body fluid culture     Status: None (Preliminary result)   Collection Time: 07/04/15  5:14 PM  Result Value Ref Range Status   Specimen Description SYNOVIAL  Final   Special Requests Normal  Final   Gram Stain MANY WBC SEEN NO ORGANISMS SEEN   Final   Culture PENDING  Incomplete   Report Status PENDING  Incomplete    RADIOLOGY:  Dg Chest 2 View  07/04/2015  CLINICAL DATA:  Shortness of breath.  Diffuse pain. EXAM: CHEST  2 VIEW COMPARISON:  10/21/2012 FINDINGS: The cardiac silhouette is upper limits of normal in size. There is increased density in the upper mediastinum to the right of the trachea  which is more prominent than  on the prior study though not completely new, in there is mild leftward deviation of the trachea at the thoracic inlet. Atherosclerotic calcification and tortuosity are again seen of the thoracic aorta. No airspace consolidation, edema, pleural effusion, pneumothorax is identified. No acute osseous abnormality is seen. IMPRESSION: 1. No evidence of acute cardiopulmonary process. 2. Increased right paratracheal density with mild mass effect on the trachea at the thoracic inlet, possibly reflecting thyroid enlargement although other mediastinal mass and vascular compression are also considerations. Consider further evaluation with outpatient chest CT. Electronically Signed   By: Sebastian Ache M.D.   On: 07/04/2015 09:35   Dg Shoulder Right  07/04/2015  CLINICAL DATA:  All over pain of right shoulder.  No injury. EXAM: RIGHT SHOULDER - 2+ VIEW COMPARISON:  None. FINDINGS: There is no evidence of fracture or dislocation. There are degenerative joint changes with narrowed joint of the right shoulder. Chronic posttraumatic deformity is identified in one of the lower right ribs. Soft tissues are unremarkable. IMPRESSION: No acute fracture or dislocation. Degenerative joint changes of right shoulder. Electronically Signed   By: Sherian Rein M.D.   On: 07/04/2015 09:35   US Venous Img Lower Unilateral Left  07/04/2015  CLINICAL DATA:  Acute left knee pain. EXAM: Left LOWER EXTREMITY VENOUS DOPPLER ULTRASOUND TECHNIQUE: Gray-scale sonography with graded compression, as well as color Doppler and duplex ultrasound were performed to evaluate the lower extremity deep venous systems from the level of the common femoral vein and including the common femoral, femoral, profunda femoral, popliteal and calf veins including the posterior tibial, peroneal and gastrocnemius veins when visible. The superficial great saphenous vein was also interrogated. Spectral Doppler was utilized to evaluate flow at  rest and with distal augmentation maneuvers in the common femoral, femoral and popliteal veins. COMPARISON:  None. FINDINGS: Contralateral Common Femoral Vein: Respiratory phasicity is normal and symmetric with the symptomatic side. No evidence of thrombus. Normal compressibility. Common Femoral Vein: No evidence of thrombus. Normal compressibility, respiratory phasicity and response to augmentation. Saphenofemoral Junction: No evidence of thrombus. Normal compressibility and flow on color Doppler imaging. Profunda Femoral Vein: No evidence of thrombus. Normal compressibility and flow on color Doppler imaging. Femoral Vein: No evidence of thrombus. Normal compressibility, respiratory phasicity and response to augmentation. Popliteal Vein: No evidence of thrombus. Normal compressibility, respiratory phasicity and response to augmentation. Calf Veins: No evidence of thrombus. Normal compressibility and flow on color Doppler imaging. Superficial Great Saphenous Vein: No evidence of thrombus. Normal compressibility and flow on color Doppler imaging. Venous Reflux:  None. Other Findings: Complex Baker cyst is seen in left popliteal fossa measuring 5.7 cm. IMPRESSION: No evidence of deep venous thrombosis seen in left lower extremity. 5.7 cm complex Baker's cyst seen in left popliteal fossa. Electronically Signed   By: Lupita Raider, M.D.   On: 07/04/2015 15:10   Dg Knee Complete 4 Views Left  07/04/2015  CLINICAL DATA:  Pain to the left knee with no injury. EXAM: LEFT KNEE - COMPLETE 4+ VIEW COMPARISON:  None. FINDINGS: There is no evidence of fracture, dislocation. There is a suprapatellar effusion. There are degenerative joint changes of left knee with narrowed sub joint space and osteophyte formation. Chondrocalcinosis is identified in the femoral tibial joint space. Soft tissues are unremarkable. IMPRESSION: No acute fracture or dislocation. Degenerative joint changes of left knee. Electronically Signed   By:  Sherian Rein M.D.   On: 07/04/2015 09:34    EKG:  No orders found for this  or any previous visit.    Management plans discussed with the patient's daughter and she is  in agreement.  CODE STATUS:     Code Status Orders        Start     Ordered   07/04/15 1401  Do not attempt resuscitation (DNR)   Continuous    Question Answer Comment  In the event of cardiac or respiratory ARREST Do not call a "code blue"   In the event of cardiac or respiratory ARREST Do not perform Intubation, CPR, defibrillation or ACLS   In the event of cardiac or respiratory ARREST Use medication by any route, position, wound care, and other measures to relive pain and suffering. May use oxygen, suction and manual treatment of airway obstruction as needed for comfort.      07/04/15 1403    Code Status History    Date Active Date Inactive Code Status Order ID Comments User Context   This patient has a current code status but no historical code status.    Advance Directive Documentation        Most Recent Value   Type of Advance Directive  Healthcare Power of Attorney   Pre-existing out of facility DNR order (yellow form or pink MOST form)     "MOST" Form in Place?        TOTAL TIME TAKING CARE OF THIS PATIENT: 45 minutes.    @  on 07/07/2015 at 12:59 PM  Between 7am to 6pm - Pager - [phone]  After 6pm go to [email or web address] - password [email or web address] [facility]  [place] [facility]  Office  [phone]  CC: Primary care physician; No primary care provider on file.

## 2015-07-07 NOTE — Progress Notes (Signed)
Patient is medically stable for D/C to [facility]. [facility]. RN will call report and arrange EMS for transport. Clinical Child psychotherapistocial Worker (CSW) sent D/C Summary, FL2 and D/C Packet to [facility] via [employer]. [facility]. Patient's daughter Marin CommentMitzi is at bedside and aware of D/C today. Please reconsult if future social work needs arise. CSW signing off.   Jetta LoutBailey Morgan, LCSW [phone]

## 2015-07-07 NOTE — Progress Notes (Signed)
Patient discharged home. DC instructions provided and explained. Medications reviewed. Rx given. All questions answered. Pt stable at discharge. Report called to [facility]. Pt left via ambulance.

## 2015-07-07 NOTE — Discharge Instructions (Signed)
Activity as tolerated per PT recommendations Follow-up with PCP at facility in 2-3 days. PCP to follow up on pending joint fluid culture results Low-salt diet

## 2015-07-07 NOTE — Care Management Important Message (Signed)
Important Message  Patient Details  Name: Maurine Ministerhelma A Maclachlan MRN: [id number] Date of Birth: 08/01/1928   Medicare Important Message Given:  Yes    Claretha Townshend A, RN 07/07/2015, 7:39 AM

## 2015-07-07 NOTE — Progress Notes (Deleted)
Pharmacy Antibiotic Note  Stephanie Duarte is a 82 y.o. female admitted on 07/04/2015 with fever of unknown origin, r/o septic arthritis.  Pharmacy has been consulted for vancomycin dosing.  Ke: 0.042 Vd: 42.3  Plan: Vancomycin trough resulted as 40 mcg/mL. New Ke 0.021 hr-1, T1/2 33.5 hr. Hold vancomycin for approximately 1 half life (when concentration expected to be less than or equal to 20 mcg/mL), then resume lower dose, vancomycin 500 mg IV Q24H, predicted trough 18 mcg/mL. Pharmacy will continue to monitor and adjust as needed to maintain trough 15 to 20 mcg/mL.   Height: 5\' 1"  (154.9 cm) Weight: 170 lb 11.2 oz (77.429 kg) IBW/kg (Calculated) : 47.8  Temp (24hrs), Avg:98.3 F (36.8 C), Min:98.3 F (36.8 C), Max:98.3 F (36.8 C)   Recent Labs Lab 07/04/15 0827 07/05/15 0424 07/06/15 0356  WBC 11.6* 10.5 8.8  CREATININE 0.87 0.94 0.77    Estimated Creatinine Clearance: 48.4 mL/min (by C-G formula based on Cr of 0.77).    No Known Allergies  Antimicrobials this admission: cefazolin 4/25 >> 4/25 Vancomycin 4/25 >>   Dose adjustments this admission:   Microbiology results: BCx: pending UCx: pending    Thank you for allowing pharmacy to be a part of this patient's care.  Carola FrostNathan A Anedra Penafiel, Pharm.D., BCPS Clinical Pharmacist 07/07/2015 5:01 AM

## 2015-07-09 LAB — CULTURE, BLOOD (ROUTINE X 2)
CULTURE: NO GROWTH
CULTURE: NO GROWTH

## 2015-07-10 LAB — BODY FLUID CULTURE
Culture: NO GROWTH
Special Requests: NORMAL

## 2015-07-12 DIAGNOSIS — F0390 Unspecified dementia without behavioral disturbance: Secondary | ICD-10-CM | POA: Diagnosis not present

## 2015-07-12 DIAGNOSIS — R600 Localized edema: Secondary | ICD-10-CM | POA: Diagnosis not present

## 2015-07-12 DIAGNOSIS — S82009A Unspecified fracture of unspecified patella, initial encounter for closed fracture: Secondary | ICD-10-CM | POA: Diagnosis not present

## 2015-07-12 DIAGNOSIS — I10 Essential (primary) hypertension: Secondary | ICD-10-CM | POA: Diagnosis not present

## 2015-07-12 DIAGNOSIS — R609 Edema, unspecified: Secondary | ICD-10-CM | POA: Diagnosis not present

## 2015-07-20 DIAGNOSIS — F39 Unspecified mood [affective] disorder: Secondary | ICD-10-CM | POA: Diagnosis not present

## 2015-07-20 DIAGNOSIS — F0390 Unspecified dementia without behavioral disturbance: Secondary | ICD-10-CM | POA: Diagnosis not present

## 2015-07-20 DIAGNOSIS — F339 Major depressive disorder, recurrent, unspecified: Secondary | ICD-10-CM | POA: Diagnosis not present

## 2015-07-26 DIAGNOSIS — I10 Essential (primary) hypertension: Secondary | ICD-10-CM | POA: Diagnosis not present

## 2015-07-26 DIAGNOSIS — R262 Difficulty in walking, not elsewhere classified: Secondary | ICD-10-CM | POA: Diagnosis not present

## 2015-07-26 DIAGNOSIS — R202 Paresthesia of skin: Secondary | ICD-10-CM | POA: Diagnosis not present

## 2015-07-26 DIAGNOSIS — R413 Other amnesia: Secondary | ICD-10-CM | POA: Diagnosis not present

## 2015-07-31 DIAGNOSIS — M1712 Unilateral primary osteoarthritis, left knee: Secondary | ICD-10-CM | POA: Diagnosis not present

## 2015-07-31 DIAGNOSIS — M12811 Other specific arthropathies, not elsewhere classified, right shoulder: Secondary | ICD-10-CM | POA: Diagnosis not present

## 2015-08-10 DIAGNOSIS — D508 Other iron deficiency anemias: Secondary | ICD-10-CM | POA: Diagnosis not present

## 2015-08-10 DIAGNOSIS — Z79899 Other long term (current) drug therapy: Secondary | ICD-10-CM | POA: Diagnosis not present

## 2015-08-14 DIAGNOSIS — I10 Essential (primary) hypertension: Secondary | ICD-10-CM | POA: Diagnosis not present

## 2015-08-14 DIAGNOSIS — R400 Somnolence: Secondary | ICD-10-CM | POA: Diagnosis not present

## 2015-08-14 DIAGNOSIS — R54 Age-related physical debility: Secondary | ICD-10-CM | POA: Diagnosis not present

## 2015-08-14 DIAGNOSIS — R1310 Dysphagia, unspecified: Secondary | ICD-10-CM | POA: Diagnosis not present

## 2015-08-14 DIAGNOSIS — M6281 Muscle weakness (generalized): Secondary | ICD-10-CM | POA: Diagnosis not present

## 2015-08-14 DIAGNOSIS — R609 Edema, unspecified: Secondary | ICD-10-CM | POA: Diagnosis not present

## 2015-08-14 DIAGNOSIS — M10462 Other secondary gout, left knee: Secondary | ICD-10-CM | POA: Diagnosis not present

## 2015-08-14 DIAGNOSIS — R269 Unspecified abnormalities of gait and mobility: Secondary | ICD-10-CM | POA: Diagnosis not present

## 2015-08-17 DIAGNOSIS — M6281 Muscle weakness (generalized): Secondary | ICD-10-CM | POA: Diagnosis not present

## 2015-08-17 DIAGNOSIS — R54 Age-related physical debility: Secondary | ICD-10-CM | POA: Diagnosis not present

## 2015-08-17 DIAGNOSIS — G309 Alzheimer's disease, unspecified: Secondary | ICD-10-CM | POA: Diagnosis not present

## 2015-08-17 DIAGNOSIS — N183 Chronic kidney disease, stage 3 (moderate): Secondary | ICD-10-CM | POA: Diagnosis not present

## 2015-08-17 DIAGNOSIS — I10 Essential (primary) hypertension: Secondary | ICD-10-CM | POA: Diagnosis not present

## 2015-08-17 DIAGNOSIS — F0280 Dementia in other diseases classified elsewhere without behavioral disturbance: Secondary | ICD-10-CM | POA: Diagnosis not present

## 2015-08-17 DIAGNOSIS — Z7982 Long term (current) use of aspirin: Secondary | ICD-10-CM | POA: Diagnosis not present

## 2015-08-17 DIAGNOSIS — M11262 Other chondrocalcinosis, left knee: Secondary | ICD-10-CM | POA: Diagnosis not present

## 2015-08-21 DIAGNOSIS — R400 Somnolence: Secondary | ICD-10-CM | POA: Diagnosis not present

## 2015-08-21 DIAGNOSIS — N390 Urinary tract infection, site not specified: Secondary | ICD-10-CM | POA: Diagnosis not present

## 2015-08-21 DIAGNOSIS — M1180 Other specified crystal arthropathies, unspecified site: Secondary | ICD-10-CM | POA: Diagnosis not present

## 2015-08-21 DIAGNOSIS — M6281 Muscle weakness (generalized): Secondary | ICD-10-CM | POA: Diagnosis not present

## 2015-08-21 DIAGNOSIS — Z79899 Other long term (current) drug therapy: Secondary | ICD-10-CM | POA: Diagnosis not present

## 2015-08-21 DIAGNOSIS — E039 Hypothyroidism, unspecified: Secondary | ICD-10-CM | POA: Diagnosis not present

## 2015-08-21 DIAGNOSIS — J45909 Unspecified asthma, uncomplicated: Secondary | ICD-10-CM | POA: Diagnosis not present

## 2015-08-21 DIAGNOSIS — I10 Essential (primary) hypertension: Secondary | ICD-10-CM | POA: Diagnosis not present

## 2015-08-21 DIAGNOSIS — R269 Unspecified abnormalities of gait and mobility: Secondary | ICD-10-CM | POA: Diagnosis not present

## 2015-09-04 DIAGNOSIS — R634 Abnormal weight loss: Secondary | ICD-10-CM | POA: Diagnosis not present

## 2015-09-04 DIAGNOSIS — Z993 Dependence on wheelchair: Secondary | ICD-10-CM | POA: Diagnosis not present

## 2015-09-04 DIAGNOSIS — E039 Hypothyroidism, unspecified: Secondary | ICD-10-CM | POA: Diagnosis not present

## 2015-09-04 DIAGNOSIS — R609 Edema, unspecified: Secondary | ICD-10-CM | POA: Diagnosis not present

## 2015-09-04 DIAGNOSIS — M6281 Muscle weakness (generalized): Secondary | ICD-10-CM | POA: Diagnosis not present

## 2015-09-04 DIAGNOSIS — G309 Alzheimer's disease, unspecified: Secondary | ICD-10-CM | POA: Diagnosis not present

## 2015-09-04 DIAGNOSIS — I872 Venous insufficiency (chronic) (peripheral): Secondary | ICD-10-CM | POA: Diagnosis not present

## 2015-09-04 DIAGNOSIS — R400 Somnolence: Secondary | ICD-10-CM | POA: Diagnosis not present

## 2015-09-18 DIAGNOSIS — Z79899 Other long term (current) drug therapy: Secondary | ICD-10-CM | POA: Diagnosis not present

## 2015-09-18 DIAGNOSIS — E039 Hypothyroidism, unspecified: Secondary | ICD-10-CM | POA: Diagnosis not present

## 2015-10-02 DIAGNOSIS — R609 Edema, unspecified: Secondary | ICD-10-CM | POA: Diagnosis not present

## 2015-10-02 DIAGNOSIS — I10 Essential (primary) hypertension: Secondary | ICD-10-CM | POA: Diagnosis not present

## 2015-10-02 DIAGNOSIS — E039 Hypothyroidism, unspecified: Secondary | ICD-10-CM | POA: Diagnosis not present

## 2015-10-02 DIAGNOSIS — R233 Spontaneous ecchymoses: Secondary | ICD-10-CM | POA: Diagnosis not present

## 2015-10-02 DIAGNOSIS — R400 Somnolence: Secondary | ICD-10-CM | POA: Diagnosis not present

## 2015-10-02 DIAGNOSIS — G309 Alzheimer's disease, unspecified: Secondary | ICD-10-CM | POA: Diagnosis not present

## 2015-10-02 DIAGNOSIS — F329 Major depressive disorder, single episode, unspecified: Secondary | ICD-10-CM | POA: Diagnosis not present

## 2015-10-02 DIAGNOSIS — F419 Anxiety disorder, unspecified: Secondary | ICD-10-CM | POA: Diagnosis not present

## 2015-10-26 DIAGNOSIS — Z79899 Other long term (current) drug therapy: Secondary | ICD-10-CM | POA: Diagnosis not present

## 2015-10-30 DIAGNOSIS — M79676 Pain in unspecified toe(s): Secondary | ICD-10-CM | POA: Diagnosis not present

## 2015-10-30 DIAGNOSIS — M24576 Contracture, unspecified foot: Secondary | ICD-10-CM | POA: Diagnosis not present

## 2015-10-30 DIAGNOSIS — R609 Edema, unspecified: Secondary | ICD-10-CM | POA: Diagnosis not present

## 2015-10-30 DIAGNOSIS — G309 Alzheimer's disease, unspecified: Secondary | ICD-10-CM | POA: Diagnosis not present

## 2015-10-30 DIAGNOSIS — B351 Tinea unguium: Secondary | ICD-10-CM | POA: Diagnosis not present

## 2015-10-30 DIAGNOSIS — F329 Major depressive disorder, single episode, unspecified: Secondary | ICD-10-CM | POA: Diagnosis not present

## 2015-10-30 DIAGNOSIS — R269 Unspecified abnormalities of gait and mobility: Secondary | ICD-10-CM | POA: Diagnosis not present

## 2015-10-30 DIAGNOSIS — Z993 Dependence on wheelchair: Secondary | ICD-10-CM | POA: Diagnosis not present

## 2015-10-30 DIAGNOSIS — F411 Generalized anxiety disorder: Secondary | ICD-10-CM | POA: Diagnosis not present

## 2015-10-30 DIAGNOSIS — M6281 Muscle weakness (generalized): Secondary | ICD-10-CM | POA: Diagnosis not present

## 2015-10-30 DIAGNOSIS — M2010 Hallux valgus (acquired), unspecified foot: Secondary | ICD-10-CM | POA: Diagnosis not present

## 2015-11-06 DIAGNOSIS — M6281 Muscle weakness (generalized): Secondary | ICD-10-CM | POA: Diagnosis not present

## 2015-11-06 DIAGNOSIS — R609 Edema, unspecified: Secondary | ICD-10-CM | POA: Diagnosis not present

## 2015-11-06 DIAGNOSIS — R635 Abnormal weight gain: Secondary | ICD-10-CM | POA: Diagnosis not present

## 2015-11-06 DIAGNOSIS — Z993 Dependence on wheelchair: Secondary | ICD-10-CM | POA: Diagnosis not present

## 2015-11-06 DIAGNOSIS — I959 Hypotension, unspecified: Secondary | ICD-10-CM | POA: Diagnosis not present

## 2015-11-06 DIAGNOSIS — G309 Alzheimer's disease, unspecified: Secondary | ICD-10-CM | POA: Diagnosis not present

## 2015-11-27 DIAGNOSIS — R269 Unspecified abnormalities of gait and mobility: Secondary | ICD-10-CM | POA: Diagnosis not present

## 2015-11-27 DIAGNOSIS — L089 Local infection of the skin and subcutaneous tissue, unspecified: Secondary | ICD-10-CM | POA: Diagnosis not present

## 2015-11-27 DIAGNOSIS — M170 Bilateral primary osteoarthritis of knee: Secondary | ICD-10-CM | POA: Diagnosis not present

## 2015-11-27 DIAGNOSIS — Z993 Dependence on wheelchair: Secondary | ICD-10-CM | POA: Diagnosis not present

## 2015-11-27 DIAGNOSIS — S81801A Unspecified open wound, right lower leg, initial encounter: Secondary | ICD-10-CM | POA: Diagnosis not present

## 2015-11-27 DIAGNOSIS — R609 Edema, unspecified: Secondary | ICD-10-CM | POA: Diagnosis not present

## 2015-11-27 DIAGNOSIS — M6281 Muscle weakness (generalized): Secondary | ICD-10-CM | POA: Diagnosis not present

## 2015-12-25 DIAGNOSIS — Z79899 Other long term (current) drug therapy: Secondary | ICD-10-CM | POA: Diagnosis not present

## 2016-01-01 DIAGNOSIS — F419 Anxiety disorder, unspecified: Secondary | ICD-10-CM | POA: Diagnosis not present

## 2016-01-01 DIAGNOSIS — M79676 Pain in unspecified toe(s): Secondary | ICD-10-CM | POA: Diagnosis not present

## 2016-01-01 DIAGNOSIS — B351 Tinea unguium: Secondary | ICD-10-CM | POA: Diagnosis not present

## 2016-01-01 DIAGNOSIS — R7301 Impaired fasting glucose: Secondary | ICD-10-CM | POA: Diagnosis not present

## 2016-01-01 DIAGNOSIS — R600 Localized edema: Secondary | ICD-10-CM | POA: Diagnosis not present

## 2016-01-01 DIAGNOSIS — N189 Chronic kidney disease, unspecified: Secondary | ICD-10-CM | POA: Diagnosis not present

## 2016-01-01 DIAGNOSIS — M11262 Other chondrocalcinosis, left knee: Secondary | ICD-10-CM | POA: Diagnosis not present

## 2016-01-01 DIAGNOSIS — G309 Alzheimer's disease, unspecified: Secondary | ICD-10-CM | POA: Diagnosis not present

## 2016-01-01 DIAGNOSIS — L89600 Pressure ulcer of unspecified heel, unstageable: Secondary | ICD-10-CM | POA: Diagnosis not present

## 2016-01-01 DIAGNOSIS — M2010 Hallux valgus (acquired), unspecified foot: Secondary | ICD-10-CM | POA: Diagnosis not present

## 2016-01-01 DIAGNOSIS — R269 Unspecified abnormalities of gait and mobility: Secondary | ICD-10-CM | POA: Diagnosis not present

## 2016-01-01 DIAGNOSIS — R635 Abnormal weight gain: Secondary | ICD-10-CM | POA: Diagnosis not present

## 2016-01-01 DIAGNOSIS — M24576 Contracture, unspecified foot: Secondary | ICD-10-CM | POA: Diagnosis not present

## 2016-01-05 DIAGNOSIS — G309 Alzheimer's disease, unspecified: Secondary | ICD-10-CM | POA: Diagnosis not present

## 2016-01-05 DIAGNOSIS — M11262 Other chondrocalcinosis, left knee: Secondary | ICD-10-CM | POA: Diagnosis not present

## 2016-01-05 DIAGNOSIS — R7301 Impaired fasting glucose: Secondary | ICD-10-CM | POA: Diagnosis not present

## 2016-01-05 DIAGNOSIS — F419 Anxiety disorder, unspecified: Secondary | ICD-10-CM | POA: Diagnosis not present

## 2016-01-05 DIAGNOSIS — L89610 Pressure ulcer of right heel, unstageable: Secondary | ICD-10-CM | POA: Diagnosis not present

## 2016-01-05 DIAGNOSIS — L89310 Pressure ulcer of right buttock, unstageable: Secondary | ICD-10-CM | POA: Diagnosis not present

## 2016-01-05 DIAGNOSIS — F0280 Dementia in other diseases classified elsewhere without behavioral disturbance: Secondary | ICD-10-CM | POA: Diagnosis not present

## 2016-01-08 DIAGNOSIS — Z993 Dependence on wheelchair: Secondary | ICD-10-CM | POA: Diagnosis not present

## 2016-01-08 DIAGNOSIS — R609 Edema, unspecified: Secondary | ICD-10-CM | POA: Diagnosis not present

## 2016-01-08 DIAGNOSIS — E119 Type 2 diabetes mellitus without complications: Secondary | ICD-10-CM | POA: Diagnosis not present

## 2016-01-08 DIAGNOSIS — L89612 Pressure ulcer of right heel, stage 2: Secondary | ICD-10-CM | POA: Diagnosis not present

## 2016-01-08 DIAGNOSIS — J45998 Other asthma: Secondary | ICD-10-CM | POA: Diagnosis not present

## 2016-01-08 DIAGNOSIS — R0902 Hypoxemia: Secondary | ICD-10-CM | POA: Diagnosis not present

## 2016-01-08 DIAGNOSIS — B0230 Zoster ocular disease, unspecified: Secondary | ICD-10-CM | POA: Diagnosis not present

## 2016-01-08 DIAGNOSIS — M6281 Muscle weakness (generalized): Secondary | ICD-10-CM | POA: Diagnosis not present

## 2016-01-15 DIAGNOSIS — L89612 Pressure ulcer of right heel, stage 2: Secondary | ICD-10-CM | POA: Diagnosis not present

## 2016-01-15 DIAGNOSIS — L089 Local infection of the skin and subcutaneous tissue, unspecified: Secondary | ICD-10-CM | POA: Diagnosis not present

## 2016-01-15 DIAGNOSIS — B029 Zoster without complications: Secondary | ICD-10-CM | POA: Diagnosis not present

## 2016-01-15 DIAGNOSIS — M1180 Other specified crystal arthropathies, unspecified site: Secondary | ICD-10-CM | POA: Diagnosis not present

## 2016-01-15 DIAGNOSIS — E119 Type 2 diabetes mellitus without complications: Secondary | ICD-10-CM | POA: Diagnosis not present

## 2016-01-15 DIAGNOSIS — M25562 Pain in left knee: Secondary | ICD-10-CM | POA: Diagnosis not present

## 2016-01-15 DIAGNOSIS — L539 Erythematous condition, unspecified: Secondary | ICD-10-CM | POA: Diagnosis not present

## 2016-01-15 DIAGNOSIS — R609 Edema, unspecified: Secondary | ICD-10-CM | POA: Diagnosis not present

## 2016-01-16 DIAGNOSIS — M10462 Other secondary gout, left knee: Secondary | ICD-10-CM | POA: Diagnosis not present

## 2016-01-16 DIAGNOSIS — Z79899 Other long term (current) drug therapy: Secondary | ICD-10-CM | POA: Diagnosis not present

## 2016-01-17 DIAGNOSIS — Z79899 Other long term (current) drug therapy: Secondary | ICD-10-CM | POA: Diagnosis not present

## 2016-01-22 DIAGNOSIS — B029 Zoster without complications: Secondary | ICD-10-CM | POA: Diagnosis not present

## 2016-01-22 DIAGNOSIS — L089 Local infection of the skin and subcutaneous tissue, unspecified: Secondary | ICD-10-CM | POA: Diagnosis not present

## 2016-01-22 DIAGNOSIS — R54 Age-related physical debility: Secondary | ICD-10-CM | POA: Diagnosis not present

## 2016-01-22 DIAGNOSIS — M1990 Unspecified osteoarthritis, unspecified site: Secondary | ICD-10-CM | POA: Diagnosis not present

## 2016-01-22 DIAGNOSIS — M25569 Pain in unspecified knee: Secondary | ICD-10-CM | POA: Diagnosis not present

## 2016-01-22 DIAGNOSIS — G309 Alzheimer's disease, unspecified: Secondary | ICD-10-CM | POA: Diagnosis not present

## 2016-01-22 DIAGNOSIS — M1180 Other specified crystal arthropathies, unspecified site: Secondary | ICD-10-CM | POA: Diagnosis not present

## 2016-01-22 DIAGNOSIS — S80812A Abrasion, left lower leg, initial encounter: Secondary | ICD-10-CM | POA: Diagnosis not present

## 2016-01-24 DIAGNOSIS — Z79899 Other long term (current) drug therapy: Secondary | ICD-10-CM | POA: Diagnosis not present

## 2016-01-30 DIAGNOSIS — R262 Difficulty in walking, not elsewhere classified: Secondary | ICD-10-CM | POA: Diagnosis not present

## 2016-01-30 DIAGNOSIS — Z6833 Body mass index (BMI) 33.0-33.9, adult: Secondary | ICD-10-CM | POA: Diagnosis not present

## 2016-01-30 DIAGNOSIS — G309 Alzheimer's disease, unspecified: Secondary | ICD-10-CM | POA: Diagnosis not present

## 2016-01-30 DIAGNOSIS — F0280 Dementia in other diseases classified elsewhere without behavioral disturbance: Secondary | ICD-10-CM | POA: Diagnosis not present

## 2016-01-30 DIAGNOSIS — F419 Anxiety disorder, unspecified: Secondary | ICD-10-CM | POA: Diagnosis not present

## 2016-01-30 DIAGNOSIS — E6609 Other obesity due to excess calories: Secondary | ICD-10-CM | POA: Diagnosis not present

## 2016-02-03 ENCOUNTER — Emergency Department: Payer: Medicare Other

## 2016-02-03 ENCOUNTER — Emergency Department
Admission: EM | Admit: 2016-02-03 | Discharge: 2016-02-03 | Disposition: A | Discharge status: Home / Self Care | Payer: Medicare Other | Attending: Student in an Organized Health Care Education/Training Program | Admitting: Student in an Organized Health Care Education/Training Program

## 2016-02-03 ENCOUNTER — Encounter: Payer: Self-pay | Admitting: Emergency Medicine

## 2016-02-03 DIAGNOSIS — G8929 Other chronic pain: Secondary | ICD-10-CM | POA: Diagnosis not present

## 2016-02-03 DIAGNOSIS — Y939 Activity, unspecified: Secondary | ICD-10-CM | POA: Diagnosis not present

## 2016-02-03 DIAGNOSIS — W07XXXA Fall from chair, initial encounter: Secondary | ICD-10-CM | POA: Insufficient documentation

## 2016-02-03 DIAGNOSIS — Y999 Unspecified external cause status: Secondary | ICD-10-CM | POA: Insufficient documentation

## 2016-02-03 DIAGNOSIS — Y929 Unspecified place or not applicable: Secondary | ICD-10-CM | POA: Insufficient documentation

## 2016-02-03 DIAGNOSIS — J45909 Unspecified asthma, uncomplicated: Secondary | ICD-10-CM | POA: Diagnosis not present

## 2016-02-03 DIAGNOSIS — Z79899 Other long term (current) drug therapy: Secondary | ICD-10-CM | POA: Diagnosis not present

## 2016-02-03 DIAGNOSIS — S0990XA Unspecified injury of head, initial encounter: Secondary | ICD-10-CM | POA: Insufficient documentation

## 2016-02-03 DIAGNOSIS — R51 Headache: Secondary | ICD-10-CM | POA: Diagnosis not present

## 2016-02-03 DIAGNOSIS — I10 Essential (primary) hypertension: Secondary | ICD-10-CM | POA: Diagnosis not present

## 2016-02-03 DIAGNOSIS — W19XXXA Unspecified fall, initial encounter: Secondary | ICD-10-CM

## 2016-02-03 DIAGNOSIS — M79602 Pain in left arm: Secondary | ICD-10-CM | POA: Diagnosis not present

## 2016-02-03 DIAGNOSIS — Z743 Need for continuous supervision: Secondary | ICD-10-CM | POA: Diagnosis not present

## 2016-02-03 DIAGNOSIS — G309 Alzheimer's disease, unspecified: Secondary | ICD-10-CM | POA: Insufficient documentation

## 2016-02-03 DIAGNOSIS — M25512 Pain in left shoulder: Secondary | ICD-10-CM | POA: Diagnosis not present

## 2016-02-03 DIAGNOSIS — Z7982 Long term (current) use of aspirin: Secondary | ICD-10-CM | POA: Insufficient documentation

## 2016-02-03 DIAGNOSIS — R6889 Other general symptoms and signs: Secondary | ICD-10-CM | POA: Diagnosis not present

## 2016-02-03 DIAGNOSIS — M542 Cervicalgia: Secondary | ICD-10-CM | POA: Diagnosis not present

## 2016-02-03 LAB — BASIC METABOLIC PANEL
Anion gap: 6 (ref 5–15)
BUN: 19 mg/dL (ref 6–20)
CHLORIDE: 102 mmol/L (ref 101–111)
CO2: 31 mmol/L (ref 22–32)
CREATININE: 1.1 mg/dL — AB (ref 0.44–1.00)
Calcium: 8.9 mg/dL (ref 8.9–10.3)
GFR calc Af Amer: 52 mL/min — ABNORMAL LOW (ref >60)
GFR calc non Af Amer: 44 mL/min — ABNORMAL LOW (ref >60)
GLUCOSE: 82 mg/dL (ref 65–99)
Potassium: 4.3 mmol/L (ref 3.5–5.1)
Sodium: 139 mmol/L (ref 135–145)

## 2016-02-03 LAB — URINALYSIS COMPLETE WITH MICROSCOPIC (ARMC ONLY)
BILIRUBIN URINE: NEGATIVE
GLUCOSE, UA: NEGATIVE mg/dL
HGB URINE DIPSTICK: NEGATIVE
KETONES UR: NEGATIVE mg/dL
NITRITE: NEGATIVE
Protein, ur: NEGATIVE mg/dL
SPECIFIC GRAVITY, URINE: 1.02 (ref 1.005–1.030)
pH: 5 (ref 5.0–8.0)

## 2016-02-03 LAB — TROPONIN I: Troponin I: <0.03 ng/mL (ref <0.03)

## 2016-02-03 NOTE — ED Triage Notes (Signed)
[facility] out of [facility] wheelchair. Staff reports they caught the end of her falling and saw her hit the back of her head on the corner of a coffee table. No hematoma present at this time. EMS reports patient c/o pain to the left upper arm,  However, no tenderness or obvious injury noted at this time.  Pt is A/O x 2 which is her baseline. Speech is clear. Only c/o pain is when checking her BP

## 2016-02-03 NOTE — ED Notes (Signed)
Returned from CT.

## 2016-02-03 NOTE — ED Notes (Signed)
Family at bedside. 

## 2016-02-03 NOTE — Discharge Instructions (Signed)
Please follow-up with primary care physician. Return immediately to the hospital should he have any worsening chest pain or shortness of breath. Return for any worsening confusion.

## 2016-02-03 NOTE — ED Provider Notes (Signed)
[facility] Emergency Department Provider Note    First MD Initiated Contact with Patient 02/03/16 0930     (approximate)  I have reviewed the triage vital signs and the nursing notes.   HISTORY  Chief Complaint Fall    HPI Stephanie Sorrowhelma A Kevan Duarte is a 82 y.o. female who presents from home place of [place] after a witnessed fall out of her wheelchair with hitting her head on the corner of a coffee table. There is no reported loss of consciousness. The patient does have a history of Alzheimer's and is amnestic to the event. She denies any other pain or tenderness at this time. Patient did report some left shoulder pain which appears to be chronic as previously documented multiple times secondary to arthritis. She has no fevers. Denies any chest pain or shortness of breath. No nausea or vomiting. He denies any numbness or tingling.   Past Medical History:  Diagnosis Date  . Asthma   . Dementia   . Hyperlipidemia   . Hypertension    History reviewed. No pertinent family history. History reviewed. No pertinent surgical history. Patient Active Problem List   Diagnosis Date Noted  . Fever 07/04/2015      Prior to Admission medications   Medication Sig Start Date End Date Taking? Authorizing Provider  acetaminophen (TYLENOL) 325 MG tablet Take 2 tablets (650 mg total) by mouth every 6 (six) hours as needed for mild pain. 07/07/15   Ramonita LabAruna Gouru, MD  albuterol (ACCUNEB) 0.63 MG/3ML nebulizer solution Take 1 ampule by nebulization 2 (two) times daily. Pt also uses every six hours as needed for wheezing.    Historical Provider, MD  albuterol (PROVENTIL HFA;VENTOLIN HFA) 108 (90 Base) MCG/ACT inhaler Inhale 2 puffs into the lungs every 6 (six) hours as needed for wheezing or shortness of breath (and/or cough).    Historical Provider, MD  amLODipine (NORVASC) 2.5 MG tablet Take 2.5 mg by mouth daily.    Historical Provider, MD  aspirin 81 MG chewable tablet Chew 81 mg  by mouth daily.    Historical Provider, MD  bisacodyl (DULCOLAX) 5 MG EC tablet Take 1 tablet (5 mg total) by mouth daily as needed for moderate constipation. 07/07/15   Ramonita LabAruna Gouru, MD  cholecalciferol (VITAMIN D) 1000 units tablet Take 4,000 Units by mouth daily.    Historical Provider, MD  citalopram (CELEXA) 20 MG tablet Take 20 mg by mouth daily.    Historical Provider, MD  docusate sodium (COLACE) 100 MG capsule Take 1 capsule (100 mg total) by mouth 2 (two) times daily. 07/07/15   Ramonita LabAruna Gouru, MD  donepezil (ARICEPT) 5 MG tablet Take 5 mg by mouth at bedtime.    Historical Provider, MD  HYDROcodone-acetaminophen (NORCO/VICODIN) 5-325 MG tablet Take 1-2 tablets by mouth every 4 (four) hours as needed for moderate pain. 07/07/15   Ramonita LabAruna Gouru, MD  memantine (NAMENDA) 5 MG tablet Take 5 mg by mouth 2 (two) times daily.    Historical Provider, MD  pravastatin (PRAVACHOL) 40 MG tablet Take 40 mg by mouth daily.    Historical Provider, MD    Allergies Patient has no known allergies.    Social History Social History  Substance Use Topics  . Smoking status: Never Smoker  . Smokeless tobacco: Never Used  . Alcohol use No    Review of Systems Patient denies headaches, rhinorrhea, blurry vision, numbness, shortness of breath, chest pain, edema, cough, abdominal pain, nausea, vomiting, diarrhea, dysuria, fevers, rashes or hallucinations  unless otherwise stated above in HPI. ____________________________________________   PHYSICAL EXAM:  VITAL SIGNS: Vitals:   02/03/16 0938 02/03/16 1103  BP: (!) 105/58 (!) 104/58  Pulse: 72 61  Resp: 16 17  Temp: 97.4 F (36.3 C)     Constitutional: Alert Pleasant and in no acute distress. Eyes: Conjunctivae are normal. PERRL. EOMI. Head: No deformity or swelling noted. Mild tenderness to palpation in the right posterior occiput. Nose: No congestion/rhinnorhea. Mouth/Throat: Mucous membranes are moist.  Oropharynx non-erythematous. Neck: No  stridor. Painless ROM. No cervical spine tenderness to palpation Hematological/Lymphatic/Immunilogical: No cervical lymphadenopathy. Cardiovascular: Normal rate, regular rhythm. Grossly normal heart sounds.  Good peripheral circulation. Respiratory: Normal respiratory effort.  No retractions. Lungs CTAB. Gastrointestinal: Soft and nontender. No distention. No abdominal bruits. No CVA tenderness.  Musculoskeletal: No lower extremity tenderness nor edema.  No joint effusions.  No ttp of BUE or with ROM. Neurologic:  Normal speech and language. No gross focal neurologic deficits are appreciated. No gait instability. Skin:  Skin is warm, dry and intact. No rash noted. Psychiatric: Mood and affect are normal. Speech and behavior are normal.  ____________________________________________   LABS (all labs ordered are listed, but only abnormal results are displayed)  Results for orders placed or performed during the hospital encounter of 02/03/16 (from the past 24 hour(s))  Urinalysis complete, with microscopic ([facility] only)     Status: Abnormal   Collection Time: 02/03/16  9:40 AM  Result Value Ref Range   Color, Urine YELLOW (A) YELLOW   APPearance HAZY (A) CLEAR   Glucose, UA NEGATIVE NEGATIVE mg/dL   Bilirubin Urine NEGATIVE NEGATIVE   Ketones, ur NEGATIVE NEGATIVE mg/dL   Specific Gravity, Urine 1.020 1.005 - 1.030   Hgb urine dipstick NEGATIVE NEGATIVE   pH 5.0 5.0 - 8.0   Protein, ur NEGATIVE NEGATIVE mg/dL   Nitrite NEGATIVE NEGATIVE   Leukocytes, UA 1+ (A) NEGATIVE   RBC / HPF 0-5 0 - 5 RBC/hpf   WBC, UA 0-5 0 - 5 WBC/hpf   Bacteria, UA RARE (A) NONE SEEN   Squamous Epithelial / LPF 0-5 (A) NONE SEEN   Mucous PRESENT   Basic metabolic panel     Status: Abnormal   Collection Time: 02/03/16 10:54 AM  Result Value Ref Range   Sodium 139 135 - 145 mmol/L   Potassium 4.3 3.5 - 5.1 mmol/L   Chloride 102 101 - 111 mmol/L   CO2 31 22 - 32 mmol/L   Glucose, Bld 82 65 - 99 mg/dL   BUN  19 6 - 20 mg/dL   Creatinine, Ser 1.61 (H) 0.44 - 1.00 mg/dL   Calcium 8.9 8.9 - 09.6 mg/dL   GFR calc non Af Amer 44 (L) >60 mL/min   GFR calc Af Amer 52 (L) >60 mL/min   Anion gap 6 5 - 15  Troponin I     Status: None   Collection Time: 02/03/16 10:54 AM  Result Value Ref Range   Troponin I <0.03 <0.03 ng/mL   ____________________________________________  EKG My review and personal interpretation at Time: 10:11   Indication: fall  Rate: 65  Rhythm: sinus Axis: normal Other: non specific st and t wave changes with osborne waves in   ED ECG REPORT I, Willy Eddy, the attending physician, personally viewed and interpreted this ECG.   Date: 02/03/2016  EKG Time: 10:53  Rate: 60  Rhythm: normal EKG, normal sinus rhythm, unchanged from previous tracings  Axis: normal  Intervals:normal  ST&T  Change: non specific ST changes with osborne wave again noted  ____________________________________________  RADIOLOGY  I personally reviewed all radiographic images ordered to evaluate for the above acute complaints and reviewed radiology reports and findings.  These findings were personally discussed with the patient.  Please see medical record for radiology report. ________________________________________   PROCEDURES  Procedure(s) performed: none Procedures    Critical Care performed: no ____________________________________________   INITIAL IMPRESSION / ASSESSMENT AND PLAN / ED COURSE  Pertinent labs & imaging results that were available during my care of the patient were reviewed by me and considered in my medical decision making (see chart for details).  DDX: sah, iph, sdh, sepsis, acs, hypothermia, electrolyte abn  Wilnette Kaleshelma A Kevan Duarte is a 82 y.o. who presents to the ED with fall out of her wheelchair. Exam as above. Patient otherwise hemodynamic stable. Will order CT imaging, UA and EKG to evaluate for acute pathology. Patient is afebrile so less consistent with sepsis  and she is it not tachycardic.  The patient will be placed on continuous pulse oximetry and telemetry for monitoring.  Laboratory evaluation will be sent to evaluate for the above complaints.     Clinical Course as of Feb 03 1220  Sat Feb 03, 2016  1038 CT imaging unremarkable..  Patient remains hemodynamic stable. Urinalysis with rare bacteria likely secondary to contamination given squamous epithelial cells. Do not feel further diagnostic testing clinically indicated at this time as patient is otherwise at her baseline.  EKG shows evidence of Osborne waves. Patient is not hyperthermic. Spoke with Dr. Ernestine McmurrayIngle who evaluated EKG. No evidence of acute ischemia. Recommended repeat EKGs and check electrolytes. Likely chronic. We'll check electrolytes as well as troponin. Likely nonspecific or secondary to medications. No evidence of ST elevations and patient denies any chest pain.  [PR]  1152 Repeat EKG shows no interval changes. Possible incomplete right bundle branch block appearing like Osborne waves. Electrolytes are unremarkable. Currently awaiting troponin.  [PR]    Clinical Course User Index [PR] Willy EddyPatrick Jazz Biddy, MD    ----------------------------------------- 12:21 PM on 02/03/2016 -----------------------------------------  Troponin negative. Patient remains hemodynamically stable. No chest pain or shortness of breath.  EKG changes likely chronic.  Patient otherwise hemodynamic stable and appropriate for discharge back to facility.Have discussed with the patient and available family all diagnostics and treatments performed thus far and all questions were answered to the best of my ability. The patient demonstrates understanding and agreement with plan.     ____________________________________________   FINAL CLINICAL IMPRESSION(S) / ED DIAGNOSES  Final diagnoses:  Fall, initial encounter  Injury of head, initial encounter      NEW MEDICATIONS STARTED DURING THIS VISIT:  New  Prescriptions   No medications on file     Note:  This document was prepared using Dragon voice recognition software and may include unintentional dictation errors.    Willy EddyPatrick Doy Taaffe, MD 02/03/16 1224

## 2016-02-03 NOTE — ED Notes (Signed)
Patient transported to CT 

## 2016-02-05 DIAGNOSIS — S0990XS Unspecified injury of head, sequela: Secondary | ICD-10-CM | POA: Diagnosis not present

## 2016-02-05 DIAGNOSIS — R269 Unspecified abnormalities of gait and mobility: Secondary | ICD-10-CM | POA: Diagnosis not present

## 2016-02-05 DIAGNOSIS — R51 Headache: Secondary | ICD-10-CM | POA: Diagnosis not present

## 2016-02-05 DIAGNOSIS — G309 Alzheimer's disease, unspecified: Secondary | ICD-10-CM | POA: Diagnosis not present

## 2016-02-05 DIAGNOSIS — Z9181 History of falling: Secondary | ICD-10-CM | POA: Diagnosis not present

## 2016-02-05 DIAGNOSIS — M79605 Pain in left leg: Secondary | ICD-10-CM | POA: Diagnosis not present

## 2016-02-05 DIAGNOSIS — R609 Edema, unspecified: Secondary | ICD-10-CM | POA: Diagnosis not present

## 2016-02-05 DIAGNOSIS — M79602 Pain in left arm: Secondary | ICD-10-CM | POA: Diagnosis not present

## 2016-03-18 DIAGNOSIS — R600 Localized edema: Secondary | ICD-10-CM | POA: Diagnosis not present

## 2016-03-18 DIAGNOSIS — E039 Hypothyroidism, unspecified: Secondary | ICD-10-CM | POA: Diagnosis not present

## 2016-03-18 DIAGNOSIS — F329 Major depressive disorder, single episode, unspecified: Secondary | ICD-10-CM | POA: Diagnosis not present

## 2016-03-18 DIAGNOSIS — Z993 Dependence on wheelchair: Secondary | ICD-10-CM | POA: Diagnosis not present

## 2016-03-18 DIAGNOSIS — M1711 Unilateral primary osteoarthritis, right knee: Secondary | ICD-10-CM | POA: Diagnosis not present

## 2016-03-18 DIAGNOSIS — M25561 Pain in right knee: Secondary | ICD-10-CM | POA: Diagnosis not present

## 2016-03-18 DIAGNOSIS — F0391 Unspecified dementia with behavioral disturbance: Secondary | ICD-10-CM | POA: Diagnosis not present

## 2016-03-18 DIAGNOSIS — F419 Anxiety disorder, unspecified: Secondary | ICD-10-CM | POA: Diagnosis not present

## 2016-03-29 DIAGNOSIS — F0390 Unspecified dementia without behavioral disturbance: Secondary | ICD-10-CM | POA: Diagnosis not present

## 2016-03-29 DIAGNOSIS — J069 Acute upper respiratory infection, unspecified: Secondary | ICD-10-CM | POA: Diagnosis not present

## 2016-03-29 DIAGNOSIS — R5383 Other fatigue: Secondary | ICD-10-CM | POA: Diagnosis not present

## 2016-03-29 DIAGNOSIS — E039 Hypothyroidism, unspecified: Secondary | ICD-10-CM | POA: Diagnosis not present

## 2016-03-29 DIAGNOSIS — J208 Acute bronchitis due to other specified organisms: Secondary | ICD-10-CM | POA: Diagnosis not present

## 2016-03-29 DIAGNOSIS — R05 Cough: Secondary | ICD-10-CM | POA: Diagnosis not present

## 2016-04-01 DIAGNOSIS — R062 Wheezing: Secondary | ICD-10-CM | POA: Diagnosis not present

## 2016-04-01 DIAGNOSIS — J441 Chronic obstructive pulmonary disease with (acute) exacerbation: Secondary | ICD-10-CM | POA: Diagnosis not present

## 2016-04-01 DIAGNOSIS — J069 Acute upper respiratory infection, unspecified: Secondary | ICD-10-CM | POA: Diagnosis not present

## 2016-04-01 DIAGNOSIS — R451 Restlessness and agitation: Secondary | ICD-10-CM | POA: Diagnosis not present

## 2016-04-01 DIAGNOSIS — R5383 Other fatigue: Secondary | ICD-10-CM | POA: Diagnosis not present

## 2016-04-01 DIAGNOSIS — R609 Edema, unspecified: Secondary | ICD-10-CM | POA: Diagnosis not present

## 2016-04-01 DIAGNOSIS — F329 Major depressive disorder, single episode, unspecified: Secondary | ICD-10-CM | POA: Diagnosis not present

## 2016-04-01 DIAGNOSIS — R05 Cough: Secondary | ICD-10-CM | POA: Diagnosis not present

## 2016-04-09 DIAGNOSIS — N183 Chronic kidney disease, stage 3 (moderate): Secondary | ICD-10-CM | POA: Diagnosis not present

## 2016-04-09 DIAGNOSIS — F419 Anxiety disorder, unspecified: Secondary | ICD-10-CM | POA: Diagnosis not present

## 2016-04-09 DIAGNOSIS — J441 Chronic obstructive pulmonary disease with (acute) exacerbation: Secondary | ICD-10-CM | POA: Diagnosis not present

## 2016-04-09 DIAGNOSIS — R54 Age-related physical debility: Secondary | ICD-10-CM | POA: Diagnosis not present

## 2016-04-09 DIAGNOSIS — G309 Alzheimer's disease, unspecified: Secondary | ICD-10-CM | POA: Diagnosis not present

## 2016-04-09 DIAGNOSIS — I10 Essential (primary) hypertension: Secondary | ICD-10-CM | POA: Diagnosis not present

## 2016-04-09 DIAGNOSIS — Z993 Dependence on wheelchair: Secondary | ICD-10-CM | POA: Diagnosis not present

## 2016-04-10 DIAGNOSIS — M2010 Hallux valgus (acquired), unspecified foot: Secondary | ICD-10-CM | POA: Diagnosis not present

## 2016-04-10 DIAGNOSIS — B351 Tinea unguium: Secondary | ICD-10-CM | POA: Diagnosis not present

## 2016-04-10 DIAGNOSIS — M79676 Pain in unspecified toe(s): Secondary | ICD-10-CM | POA: Diagnosis not present

## 2016-04-10 DIAGNOSIS — M24576 Contracture, unspecified foot: Secondary | ICD-10-CM | POA: Diagnosis not present

## 2016-04-22 DIAGNOSIS — J449 Chronic obstructive pulmonary disease, unspecified: Secondary | ICD-10-CM | POA: Diagnosis not present

## 2016-04-22 DIAGNOSIS — E119 Type 2 diabetes mellitus without complications: Secondary | ICD-10-CM | POA: Diagnosis not present

## 2016-04-22 DIAGNOSIS — R451 Restlessness and agitation: Secondary | ICD-10-CM | POA: Diagnosis not present

## 2016-04-22 DIAGNOSIS — E039 Hypothyroidism, unspecified: Secondary | ICD-10-CM | POA: Diagnosis not present

## 2016-04-22 DIAGNOSIS — N390 Urinary tract infection, site not specified: Secondary | ICD-10-CM | POA: Diagnosis not present

## 2016-04-22 DIAGNOSIS — Z79899 Other long term (current) drug therapy: Secondary | ICD-10-CM | POA: Diagnosis not present

## 2016-04-25 DIAGNOSIS — R531 Weakness: Secondary | ICD-10-CM | POA: Diagnosis not present

## 2016-04-25 DIAGNOSIS — N189 Chronic kidney disease, unspecified: Secondary | ICD-10-CM | POA: Diagnosis not present

## 2016-04-25 DIAGNOSIS — G309 Alzheimer's disease, unspecified: Secondary | ICD-10-CM | POA: Diagnosis not present

## 2016-04-25 DIAGNOSIS — N390 Urinary tract infection, site not specified: Secondary | ICD-10-CM | POA: Diagnosis not present

## 2016-04-29 DIAGNOSIS — Z79899 Other long term (current) drug therapy: Secondary | ICD-10-CM | POA: Diagnosis not present

## 2016-05-04 DIAGNOSIS — G309 Alzheimer's disease, unspecified: Secondary | ICD-10-CM | POA: Diagnosis not present

## 2016-05-04 DIAGNOSIS — F419 Anxiety disorder, unspecified: Secondary | ICD-10-CM | POA: Diagnosis not present

## 2016-05-04 DIAGNOSIS — N183 Chronic kidney disease, stage 3 (moderate): Secondary | ICD-10-CM | POA: Diagnosis not present

## 2016-05-04 DIAGNOSIS — R54 Age-related physical debility: Secondary | ICD-10-CM | POA: Diagnosis not present

## 2016-05-06 DIAGNOSIS — Z79899 Other long term (current) drug therapy: Secondary | ICD-10-CM | POA: Diagnosis not present

## 2016-05-09 DIAGNOSIS — R600 Localized edema: Secondary | ICD-10-CM | POA: Diagnosis not present

## 2016-05-09 DIAGNOSIS — E876 Hypokalemia: Secondary | ICD-10-CM | POA: Diagnosis not present

## 2016-05-09 DIAGNOSIS — N179 Acute kidney failure, unspecified: Secondary | ICD-10-CM | POA: Diagnosis not present

## 2016-05-09 DIAGNOSIS — E119 Type 2 diabetes mellitus without complications: Secondary | ICD-10-CM | POA: Diagnosis not present

## 2016-05-16 DIAGNOSIS — J449 Chronic obstructive pulmonary disease, unspecified: Secondary | ICD-10-CM | POA: Diagnosis not present

## 2016-05-16 DIAGNOSIS — R600 Localized edema: Secondary | ICD-10-CM | POA: Diagnosis not present

## 2016-05-16 DIAGNOSIS — R635 Abnormal weight gain: Secondary | ICD-10-CM | POA: Diagnosis not present

## 2016-05-16 DIAGNOSIS — F329 Major depressive disorder, single episode, unspecified: Secondary | ICD-10-CM | POA: Diagnosis not present

## 2016-05-20 DIAGNOSIS — R600 Localized edema: Secondary | ICD-10-CM | POA: Diagnosis not present

## 2016-05-22 DIAGNOSIS — B351 Tinea unguium: Secondary | ICD-10-CM | POA: Diagnosis not present

## 2016-05-22 DIAGNOSIS — M2010 Hallux valgus (acquired), unspecified foot: Secondary | ICD-10-CM | POA: Diagnosis not present

## 2016-05-22 DIAGNOSIS — M24576 Contracture, unspecified foot: Secondary | ICD-10-CM | POA: Diagnosis not present

## 2016-05-22 DIAGNOSIS — M79676 Pain in unspecified toe(s): Secondary | ICD-10-CM | POA: Diagnosis not present

## 2016-05-23 DIAGNOSIS — R609 Edema, unspecified: Secondary | ICD-10-CM | POA: Diagnosis not present

## 2016-05-23 DIAGNOSIS — E039 Hypothyroidism, unspecified: Secondary | ICD-10-CM | POA: Diagnosis not present

## 2016-05-23 DIAGNOSIS — G309 Alzheimer's disease, unspecified: Secondary | ICD-10-CM | POA: Diagnosis not present

## 2016-05-23 DIAGNOSIS — K5900 Constipation, unspecified: Secondary | ICD-10-CM | POA: Diagnosis not present

## 2016-06-03 DIAGNOSIS — E039 Hypothyroidism, unspecified: Secondary | ICD-10-CM | POA: Diagnosis not present

## 2016-06-03 DIAGNOSIS — Z79899 Other long term (current) drug therapy: Secondary | ICD-10-CM | POA: Diagnosis not present

## 2016-06-06 DIAGNOSIS — G309 Alzheimer's disease, unspecified: Secondary | ICD-10-CM | POA: Diagnosis not present

## 2016-06-06 DIAGNOSIS — I5032 Chronic diastolic (congestive) heart failure: Secondary | ICD-10-CM | POA: Diagnosis not present

## 2016-06-06 DIAGNOSIS — E1159 Type 2 diabetes mellitus with other circulatory complications: Secondary | ICD-10-CM | POA: Diagnosis not present

## 2016-06-06 DIAGNOSIS — R600 Localized edema: Secondary | ICD-10-CM | POA: Diagnosis not present

## 2016-06-18 DIAGNOSIS — E1159 Type 2 diabetes mellitus with other circulatory complications: Secondary | ICD-10-CM | POA: Diagnosis not present

## 2016-06-18 DIAGNOSIS — R451 Restlessness and agitation: Secondary | ICD-10-CM | POA: Diagnosis not present

## 2016-06-18 DIAGNOSIS — E039 Hypothyroidism, unspecified: Secondary | ICD-10-CM | POA: Diagnosis not present

## 2016-06-18 DIAGNOSIS — G309 Alzheimer's disease, unspecified: Secondary | ICD-10-CM | POA: Diagnosis not present

## 2016-06-25 DIAGNOSIS — M1000 Idiopathic gout, unspecified site: Secondary | ICD-10-CM | POA: Diagnosis not present

## 2016-06-25 DIAGNOSIS — R600 Localized edema: Secondary | ICD-10-CM | POA: Diagnosis not present

## 2016-06-25 DIAGNOSIS — N390 Urinary tract infection, site not specified: Secondary | ICD-10-CM | POA: Diagnosis not present

## 2016-06-25 DIAGNOSIS — E039 Hypothyroidism, unspecified: Secondary | ICD-10-CM | POA: Diagnosis not present

## 2016-06-26 DIAGNOSIS — R4182 Altered mental status, unspecified: Secondary | ICD-10-CM | POA: Diagnosis not present

## 2016-06-26 DIAGNOSIS — G309 Alzheimer's disease, unspecified: Secondary | ICD-10-CM | POA: Diagnosis not present

## 2016-06-26 DIAGNOSIS — R54 Age-related physical debility: Secondary | ICD-10-CM | POA: Diagnosis not present

## 2016-06-26 DIAGNOSIS — F419 Anxiety disorder, unspecified: Secondary | ICD-10-CM | POA: Diagnosis not present

## 2016-07-02 DIAGNOSIS — E1159 Type 2 diabetes mellitus with other circulatory complications: Secondary | ICD-10-CM | POA: Diagnosis not present

## 2016-07-02 DIAGNOSIS — G309 Alzheimer's disease, unspecified: Secondary | ICD-10-CM | POA: Diagnosis not present

## 2016-07-02 DIAGNOSIS — N390 Urinary tract infection, site not specified: Secondary | ICD-10-CM | POA: Diagnosis not present

## 2016-07-02 DIAGNOSIS — J449 Chronic obstructive pulmonary disease, unspecified: Secondary | ICD-10-CM | POA: Diagnosis not present

## 2016-07-04 DIAGNOSIS — I5030 Unspecified diastolic (congestive) heart failure: Secondary | ICD-10-CM | POA: Diagnosis not present

## 2016-07-04 DIAGNOSIS — G309 Alzheimer's disease, unspecified: Secondary | ICD-10-CM | POA: Diagnosis not present

## 2016-07-04 DIAGNOSIS — F329 Major depressive disorder, single episode, unspecified: Secondary | ICD-10-CM | POA: Diagnosis not present

## 2016-07-04 DIAGNOSIS — E876 Hypokalemia: Secondary | ICD-10-CM | POA: Diagnosis not present

## 2016-07-16 DIAGNOSIS — R062 Wheezing: Secondary | ICD-10-CM | POA: Diagnosis not present

## 2016-07-16 DIAGNOSIS — G309 Alzheimer's disease, unspecified: Secondary | ICD-10-CM | POA: Diagnosis not present

## 2016-07-16 DIAGNOSIS — E1159 Type 2 diabetes mellitus with other circulatory complications: Secondary | ICD-10-CM | POA: Diagnosis not present

## 2016-07-23 DIAGNOSIS — E039 Hypothyroidism, unspecified: Secondary | ICD-10-CM | POA: Diagnosis not present

## 2016-07-23 DIAGNOSIS — M1000 Idiopathic gout, unspecified site: Secondary | ICD-10-CM | POA: Diagnosis not present

## 2016-07-23 DIAGNOSIS — J449 Chronic obstructive pulmonary disease, unspecified: Secondary | ICD-10-CM | POA: Diagnosis not present

## 2016-07-23 DIAGNOSIS — R05 Cough: Secondary | ICD-10-CM | POA: Diagnosis not present

## 2016-07-30 DIAGNOSIS — G309 Alzheimer's disease, unspecified: Secondary | ICD-10-CM | POA: Diagnosis not present

## 2016-07-30 DIAGNOSIS — E6609 Other obesity due to excess calories: Secondary | ICD-10-CM | POA: Diagnosis not present

## 2016-07-30 DIAGNOSIS — F419 Anxiety disorder, unspecified: Secondary | ICD-10-CM | POA: Diagnosis not present

## 2016-07-30 DIAGNOSIS — R262 Difficulty in walking, not elsewhere classified: Secondary | ICD-10-CM | POA: Diagnosis not present

## 2016-07-31 DIAGNOSIS — M2010 Hallux valgus (acquired), unspecified foot: Secondary | ICD-10-CM | POA: Diagnosis not present

## 2016-07-31 DIAGNOSIS — M79676 Pain in unspecified toe(s): Secondary | ICD-10-CM | POA: Diagnosis not present

## 2016-07-31 DIAGNOSIS — B351 Tinea unguium: Secondary | ICD-10-CM | POA: Diagnosis not present

## 2016-07-31 DIAGNOSIS — M24576 Contracture, unspecified foot: Secondary | ICD-10-CM | POA: Diagnosis not present

## 2016-08-01 DIAGNOSIS — E1159 Type 2 diabetes mellitus with other circulatory complications: Secondary | ICD-10-CM | POA: Diagnosis not present

## 2016-08-01 DIAGNOSIS — K5900 Constipation, unspecified: Secondary | ICD-10-CM | POA: Diagnosis not present

## 2016-08-01 DIAGNOSIS — R54 Age-related physical debility: Secondary | ICD-10-CM | POA: Diagnosis not present

## 2016-08-01 DIAGNOSIS — G309 Alzheimer's disease, unspecified: Secondary | ICD-10-CM | POA: Diagnosis not present

## 2016-08-02 DIAGNOSIS — J441 Chronic obstructive pulmonary disease with (acute) exacerbation: Secondary | ICD-10-CM | POA: Diagnosis not present

## 2016-08-02 DIAGNOSIS — F419 Anxiety disorder, unspecified: Secondary | ICD-10-CM | POA: Diagnosis not present

## 2016-08-02 DIAGNOSIS — E785 Hyperlipidemia, unspecified: Secondary | ICD-10-CM | POA: Diagnosis not present

## 2016-08-02 DIAGNOSIS — Z79899 Other long term (current) drug therapy: Secondary | ICD-10-CM | POA: Diagnosis not present

## 2016-08-02 DIAGNOSIS — R54 Age-related physical debility: Secondary | ICD-10-CM | POA: Diagnosis not present

## 2016-08-02 DIAGNOSIS — G309 Alzheimer's disease, unspecified: Secondary | ICD-10-CM | POA: Diagnosis not present

## 2016-08-02 DIAGNOSIS — I5032 Chronic diastolic (congestive) heart failure: Secondary | ICD-10-CM | POA: Diagnosis not present

## 2016-08-07 DIAGNOSIS — G309 Alzheimer's disease, unspecified: Secondary | ICD-10-CM | POA: Diagnosis not present

## 2016-08-07 DIAGNOSIS — F419 Anxiety disorder, unspecified: Secondary | ICD-10-CM | POA: Diagnosis not present

## 2016-08-07 DIAGNOSIS — I5032 Chronic diastolic (congestive) heart failure: Secondary | ICD-10-CM | POA: Diagnosis not present

## 2016-08-07 DIAGNOSIS — R54 Age-related physical debility: Secondary | ICD-10-CM | POA: Diagnosis not present

## 2016-08-19 DIAGNOSIS — Z79899 Other long term (current) drug therapy: Secondary | ICD-10-CM | POA: Diagnosis not present

## 2016-08-21 DIAGNOSIS — E1139 Type 2 diabetes mellitus with other diabetic ophthalmic complication: Secondary | ICD-10-CM | POA: Diagnosis not present

## 2016-08-21 DIAGNOSIS — H35111 Retinopathy of prematurity, stage 0, right eye: Secondary | ICD-10-CM | POA: Diagnosis not present

## 2016-08-21 DIAGNOSIS — H353190 Nonexudative age-related macular degeneration, unspecified eye, stage unspecified: Secondary | ICD-10-CM | POA: Diagnosis not present

## 2016-10-30 DIAGNOSIS — F419 Anxiety disorder, unspecified: Secondary | ICD-10-CM | POA: Diagnosis not present

## 2016-10-30 DIAGNOSIS — G251 Drug-induced tremor: Secondary | ICD-10-CM | POA: Diagnosis not present

## 2016-10-30 DIAGNOSIS — G309 Alzheimer's disease, unspecified: Secondary | ICD-10-CM | POA: Diagnosis not present

## 2016-10-30 DIAGNOSIS — E6609 Other obesity due to excess calories: Secondary | ICD-10-CM | POA: Diagnosis not present

## 2016-11-03 DIAGNOSIS — E119 Type 2 diabetes mellitus without complications: Secondary | ICD-10-CM | POA: Diagnosis not present

## 2016-11-03 DIAGNOSIS — M11262 Other chondrocalcinosis, left knee: Secondary | ICD-10-CM | POA: Diagnosis not present

## 2016-11-03 DIAGNOSIS — Z7982 Long term (current) use of aspirin: Secondary | ICD-10-CM | POA: Diagnosis not present

## 2016-11-03 DIAGNOSIS — F419 Anxiety disorder, unspecified: Secondary | ICD-10-CM | POA: Diagnosis not present

## 2016-11-03 DIAGNOSIS — Z7984 Long term (current) use of oral hypoglycemic drugs: Secondary | ICD-10-CM | POA: Diagnosis not present

## 2016-11-03 DIAGNOSIS — F0280 Dementia in other diseases classified elsewhere without behavioral disturbance: Secondary | ICD-10-CM | POA: Diagnosis not present

## 2016-11-03 DIAGNOSIS — J449 Chronic obstructive pulmonary disease, unspecified: Secondary | ICD-10-CM | POA: Diagnosis not present

## 2016-11-03 DIAGNOSIS — G309 Alzheimer's disease, unspecified: Secondary | ICD-10-CM | POA: Diagnosis not present

## 2016-12-02 DIAGNOSIS — R600 Localized edema: Secondary | ICD-10-CM | POA: Diagnosis not present

## 2016-12-02 DIAGNOSIS — M1009 Idiopathic gout, multiple sites: Secondary | ICD-10-CM | POA: Diagnosis not present

## 2016-12-02 DIAGNOSIS — I10 Essential (primary) hypertension: Secondary | ICD-10-CM | POA: Diagnosis not present

## 2016-12-02 DIAGNOSIS — E785 Hyperlipidemia, unspecified: Secondary | ICD-10-CM | POA: Diagnosis not present

## 2016-12-05 DIAGNOSIS — E785 Hyperlipidemia, unspecified: Secondary | ICD-10-CM | POA: Diagnosis not present

## 2016-12-05 DIAGNOSIS — E119 Type 2 diabetes mellitus without complications: Secondary | ICD-10-CM | POA: Diagnosis not present

## 2016-12-05 DIAGNOSIS — I10 Essential (primary) hypertension: Secondary | ICD-10-CM | POA: Diagnosis not present

## 2016-12-05 DIAGNOSIS — E559 Vitamin D deficiency, unspecified: Secondary | ICD-10-CM | POA: Diagnosis not present

## 2016-12-05 DIAGNOSIS — M1009 Idiopathic gout, multiple sites: Secondary | ICD-10-CM | POA: Diagnosis not present

## 2017-01-13 DIAGNOSIS — R4182 Altered mental status, unspecified: Secondary | ICD-10-CM | POA: Diagnosis not present

## 2017-03-22 ENCOUNTER — Other Ambulatory Visit: Payer: Self-pay

## 2017-03-22 ENCOUNTER — Emergency Department: Payer: Medicare Other

## 2017-03-22 ENCOUNTER — Emergency Department
Admission: EM | Admit: 2017-03-22 | Discharge: 2017-03-22 | Disposition: A | Discharge status: Home / Self Care | Payer: Medicare Other | Attending: Emergency Medicine | Admitting: Emergency Medicine

## 2017-03-22 ENCOUNTER — Encounter: Payer: Self-pay | Admitting: Emergency Medicine

## 2017-03-22 DIAGNOSIS — E860 Dehydration: Secondary | ICD-10-CM | POA: Diagnosis not present

## 2017-03-22 DIAGNOSIS — E041 Nontoxic single thyroid nodule: Secondary | ICD-10-CM | POA: Insufficient documentation

## 2017-03-22 DIAGNOSIS — Z79899 Other long term (current) drug therapy: Secondary | ICD-10-CM | POA: Diagnosis not present

## 2017-03-22 DIAGNOSIS — K7689 Other specified diseases of liver: Secondary | ICD-10-CM | POA: Insufficient documentation

## 2017-03-22 DIAGNOSIS — Z743 Need for continuous supervision: Secondary | ICD-10-CM | POA: Diagnosis not present

## 2017-03-22 DIAGNOSIS — J45901 Unspecified asthma with (acute) exacerbation: Secondary | ICD-10-CM | POA: Insufficient documentation

## 2017-03-22 DIAGNOSIS — R4020 Unspecified coma: Secondary | ICD-10-CM | POA: Diagnosis not present

## 2017-03-22 DIAGNOSIS — F0390 Unspecified dementia without behavioral disturbance: Secondary | ICD-10-CM | POA: Insufficient documentation

## 2017-03-22 DIAGNOSIS — J984 Other disorders of lung: Secondary | ICD-10-CM | POA: Diagnosis not present

## 2017-03-22 DIAGNOSIS — N179 Acute kidney failure, unspecified: Secondary | ICD-10-CM

## 2017-03-22 DIAGNOSIS — E079 Disorder of thyroid, unspecified: Secondary | ICD-10-CM | POA: Diagnosis not present

## 2017-03-22 DIAGNOSIS — N390 Urinary tract infection, site not specified: Secondary | ICD-10-CM | POA: Diagnosis present

## 2017-03-22 DIAGNOSIS — R4182 Altered mental status, unspecified: Secondary | ICD-10-CM | POA: Diagnosis not present

## 2017-03-22 DIAGNOSIS — R918 Other nonspecific abnormal finding of lung field: Secondary | ICD-10-CM | POA: Diagnosis not present

## 2017-03-22 DIAGNOSIS — E0789 Other specified disorders of thyroid: Secondary | ICD-10-CM | POA: Diagnosis not present

## 2017-03-22 LAB — CBC WITH DIFFERENTIAL/PLATELET
BASOS ABS: 0.1 10*3/uL (ref 0–0.1)
Basophils Relative: 1 %
EOS ABS: 0.3 10*3/uL (ref 0–0.7)
EOS PCT: 4 %
HCT: 39.8 % (ref 35.0–47.0)
Hemoglobin: 13.3 g/dL (ref 12.0–16.0)
LYMPHS PCT: 25 %
Lymphs Abs: 2.1 10*3/uL (ref 1.0–3.6)
MCH: 31.1 pg (ref 26.0–34.0)
MCHC: 33.3 g/dL (ref 32.0–36.0)
MCV: 93.5 fL (ref 80.0–100.0)
MONO ABS: 0.8 10*3/uL (ref 0.2–0.9)
Monocytes Relative: 10 %
Neutro Abs: 5.2 10*3/uL (ref 1.4–6.5)
Neutrophils Relative %: 60 %
PLATELETS: 198 10*3/uL (ref 150–440)
RBC: 4.26 MIL/uL (ref 3.80–5.20)
RDW: 15.6 % — AB (ref 11.5–14.5)
WBC: 8.6 10*3/uL (ref 3.6–11.0)

## 2017-03-22 LAB — BASIC METABOLIC PANEL
Anion gap: 8 (ref 5–15)
BUN: 23 mg/dL — AB (ref 6–20)
CALCIUM: 9.8 mg/dL (ref 8.9–10.3)
CO2: 33 mmol/L — ABNORMAL HIGH (ref 22–32)
CREATININE: 1.17 mg/dL — AB (ref 0.44–1.00)
Chloride: 99 mmol/L — ABNORMAL LOW (ref 101–111)
GFR calc Af Amer: 47 mL/min — ABNORMAL LOW (ref >60)
GFR, EST NON AFRICAN AMERICAN: 41 mL/min — AB (ref >60)
GLUCOSE: 95 mg/dL (ref 65–99)
POTASSIUM: 4.4 mmol/L (ref 3.5–5.1)
Sodium: 140 mmol/L (ref 135–145)

## 2017-03-22 LAB — URINALYSIS, COMPLETE (UACMP) WITH MICROSCOPIC
Bacteria, UA: NONE SEEN
Bilirubin Urine: NEGATIVE
Glucose, UA: NEGATIVE mg/dL
HGB URINE DIPSTICK: NEGATIVE
Ketones, ur: NEGATIVE mg/dL
LEUKOCYTES UA: NEGATIVE
Nitrite: NEGATIVE
Protein, ur: NEGATIVE mg/dL
RBC / HPF: NONE SEEN RBC/hpf (ref 0–5)
SPECIFIC GRAVITY, URINE: 1.01 (ref 1.005–1.030)
pH: 5 (ref 5.0–8.0)

## 2017-03-22 MED ORDER — IOPAMIDOL (ISOVUE-300) INJECTION 61%
60.0000 mL | Freq: Once | INTRAVENOUS | Status: AC | PRN
  Administered 2017-03-22: 60 mL via INTRAVENOUS

## 2017-03-22 MED ORDER — METHYLPREDNISOLONE SODIUM SUCC 125 MG IJ SOLR
125.0000 mg | Freq: Once | INTRAMUSCULAR | Status: AC
  Administered 2017-03-22: 125 mg via INTRAVENOUS
  Filled 2017-03-22: qty 2

## 2017-03-22 MED ORDER — SODIUM CHLORIDE 0.9 % IV BOLUS (SEPSIS)
1000.0000 mL | Freq: Once | INTRAVENOUS | Status: AC
  Administered 2017-03-22: 1000 mL via INTRAVENOUS

## 2017-03-22 MED ORDER — PREDNISONE 10 MG PO TABS: ORAL_TABLET | ORAL | 0 refills | Status: DC

## 2017-03-22 MED ORDER — IPRATROPIUM-ALBUTEROL 0.5-2.5 (3) MG/3ML IN SOLN
3.0000 mL | Freq: Once | RESPIRATORY_TRACT | Status: AC
  Administered 2017-03-22: 3 mL via RESPIRATORY_TRACT
  Filled 2017-03-22: qty 3

## 2017-03-22 NOTE — ED Triage Notes (Signed)
Ems was called out because pt wouldn't respond to the [facility] staff. Per staff her urine this am had a strong odor. Pt is alert and back to baseline per daughter. Hx of dementia.

## 2017-03-22 NOTE — ED Provider Notes (Signed)
[facility] Emergency Department Provider Note ____________________________________________   I have reviewed the triage vital signs and the triage nursing note.  HISTORY  Chief Complaint Urinary Tract Infection and Wheezing   Historian Level 5 Caveat History Limited by dementia History provided by report from nursing home staff History provided in person by patient's daughter who was actually at the nursing home.  HPI Stephanie Duarte is a 84 y.o. female with a history of asthma and dementia, presents with complaint of decreased mental status, not as alert as she typically is.  She is also noted to be wheezing.  Daughter was not aware that she had asthma, but it is listed on her past medical history.  Sounds like symptoms were just over the last 24 hours.  There is no reported fever.  Symptoms are moderate..  Patient has had previous urinary tract infections before.   Past Medical History:  Diagnosis Date  . Asthma   . Dementia   . Hyperlipidemia   . Hypertension     Patient Active Problem List   Diagnosis Date Noted  . Fever 07/04/2015    History reviewed. No pertinent surgical history.  Prior to Admission medications   Medication Sig Start Date End Date Taking? Authorizing Provider  acetaminophen (TYLENOL) 325 MG tablet Take 2 tablets (650 mg total) by mouth every 6 (six) hours as needed for mild pain. 07/07/15   Gouru, Deanna Artis, MD  albuterol (ACCUNEB) 0.63 MG/3ML nebulizer solution Take 1 ampule by nebulization 2 (two) times daily. Pt also uses every six hours as needed for wheezing.    [provider]  albuterol (PROVENTIL HFA;VENTOLIN HFA) 108 (90 Base) MCG/ACT inhaler Inhale 2 puffs into the lungs every 6 (six) hours as needed for wheezing or shortness of breath (and/or cough).    [provider]  amLODipine (NORVASC) 2.5 MG tablet Take 2.5 mg by mouth daily.    [provider]  aspirin 81 MG chewable tablet Chew 81  mg by mouth daily.    [provider]  bisacodyl (DULCOLAX) 5 MG EC tablet Take 1 tablet (5 mg total) by mouth daily as needed for moderate constipation. 07/07/15   Ramonita Lab, MD  cholecalciferol (VITAMIN D) 1000 units tablet Take 4,000 Units by mouth daily.    [provider]  citalopram (CELEXA) 20 MG tablet Take 20 mg by mouth daily.    [provider]  docusate sodium (COLACE) 100 MG capsule Take 1 capsule (100 mg total) by mouth 2 (two) times daily. 07/07/15   Gouru, Deanna Artis, MD  donepezil (ARICEPT) 5 MG tablet Take 5 mg by mouth at bedtime.    [provider]  HYDROcodone-acetaminophen (NORCO/VICODIN) 5-325 MG tablet Take 1-2 tablets by mouth every 4 (four) hours as needed for moderate pain. 07/07/15   Gouru, Deanna Artis, MD  memantine (NAMENDA) 5 MG tablet Take 5 mg by mouth 2 (two) times daily.    [provider]  pravastatin (PRAVACHOL) 40 MG tablet Take 40 mg by mouth daily.    [provider]  predniSONE (DELTASONE) 10 MG tablet 40 mg once daily for 4 more days 03/22/17   Governor Rooks, MD    No Known Allergies  History reviewed. No pertinent family history.  Social History Social History   Tobacco Use  . Smoking status: Never Smoker  . Smokeless tobacco: Never Used  Substance Use Topics  . Alcohol use: No  . Drug use: Not on file    Review of Systems  Level 5 Caveat History Limited by dementia  Denies chest pain, trouble breathing, abdominal pain to me  ____________________________________________   PHYSICAL EXAM:  VITAL SIGNS: ED Triage Vitals [03/22/17 1245]  Enc Vitals Group     BP 100/72     Pulse Rate 63     Resp 18     Temp 98.1 F (36.7 C)     Temp Source Oral     SpO2 94 %     Weight 160 lb (72.6 kg)     Height 5\' 4"  (1.626 m)     Head Circumference      Peak Flow      Pain Score      Pain Loc      Pain Edu?      Excl. in GC?      Constitutional: Alert and opens eyes to voice and tries to follow  commands. Dementia, poor historian. HEENT   Head: Normocephalic and atraumatic.      Eyes: Conjunctivae are normal. Pupils equal and round.       Ears:         Nose: No congestion/rhinnorhea.   Mouth/Throat: Mucous membranes are moderately dry.   Neck: No stridor. Cardiovascular/Chest: Normal rate, regular rhythm.  No murmurs, rubs, or gallops. Respiratory: Normal respiratory effort without tachypnea nor retractions.  Audible wheezing without stethoscope, all fields, moderate.  Moderate decreased breath sounds at the bases.  No rhonchi. Gastrointestinal: Soft. No distention, no guarding, no rebound. Nontender.   Obese Genitourinary/rectal:Deferred Musculoskeletal: Nontender with normal range of motion in all extremities. No joint effusions.  No lower extremity tenderness.  No edema. Neurologic:  No facial droop.  Generalized weakness, 4/5 strength in 4 extremities.  Unable to cooperate with coordination exam.   Skin:  Skin is warm, dry and intact. No rash noted. Psychiatric:  No agitation   ____________________________________________  LABS (pertinent positives/negatives) I, Governor Rooks, MD the attending physician have reviewed the labs noted below.  Labs Reviewed  BASIC METABOLIC PANEL - Abnormal; Notable for the following components:      Result Value   Chloride 99 (*)    CO2 33 (*)    BUN 23 (*)    Creatinine, Ser 1.17 (*)    GFR calc non Af Amer 41 (*)    GFR calc Af Amer 47 (*)    All other components within normal limits  CBC WITH DIFFERENTIAL/PLATELET - Abnormal; Notable for the following components:   RDW 15.6 (*)    All other components within normal limits  URINALYSIS, COMPLETE (UACMP) WITH MICROSCOPIC - Abnormal; Notable for the following components:   Color, Urine STRAW (*)    APPearance CLEAR (*)    Squamous Epithelial / LPF 0-5 (*)    All other components within normal limits    ____________________________________________    EKG I, Governor Rooks,  MD, the attending physician have personally viewed and interpreted all ECGs.  77 bpm.  Normal sinus rhythm.  Narrow QRS.  Normal axis.  Nonspecific ST and T wave ____________________________________________  RADIOLOGY All Xrays were viewed by me.  Imaging interpreted by Radiologist, and I, Governor Rooks, MD the attending physician have reviewed the radiologist interpretation noted below.  Cxr:  IMPRESSION: 1. No acute abnormality. 2. Stable right paratracheal soft tissue density and deviation of the trachea to the left with some tracheal narrowing. This could be due to a substernal goiter. A chest CT with contrast could help exclude a mass.  CT head without contrast:  IMPRESSION: No acute intracranial abnormality.  Atrophy, chronic microvascular disease.  Ct chest with contrast:  IMPRESSION: 1. Chest x-ray abnormality is due to a right-sided thyroid goiter with mild mass effect on the trachea. An enlarged right lobe extends into the upper mediastinum and contains a dominant solid and cystic area measuring up to 4 cm in height. Further characterization with elective thyroid ultrasound is recommended. 2. Coronary atherosclerosis with calcified plaque in the distribution of the LAD and left circumflex coronary arteries. 3. Multiple cystic abnormalities of the liver. Although these likely represent benign cysts, these are not well characterized on the current CT study. Consider follow-up with abdominal ultrasound versus multiphase CT. 4. Potential subtle irregular thickening involving the distal esophagus and proximal stomach. Gastroesophageal mass is not excluded. __________________________________________  PROCEDURES  Procedure(s) performed: None  Critical Care performed: None   ____________________________________________  ED COURSE / ASSESSMENT AND PLAN  Pertinent labs & imaging results that were available during my care of the patient were reviewed by me and  considered in my medical decision making (see chart for details).   She is wheezing, will be given DuoNeb.  Although daughter did not realize she had lung issues, possible history includes asthma she does have albuterol listed.  This may be asthma exacerbation.  She has not had any hypoxia.  Chest x-ray showed no infiltrate but concern for possible mass, discussed with daughter obtaining chest CT for further investigation.  Daughter also feels like the patient is at decreased level of responsiveness, and this could be related to dehydration with mild acute renal failure.  She will be given small fluid bolus.  We also discussed obtaining CT the head to rule out no intracranial finding as a cause for the altered mental status.  CT head reassuring.  I suspect decreased level of consciousness may be related to dementia with dehydration.  Patient was given IV fluid bolus here.  Wheezing seems to be consistent with her asthma exacerbation, I do not think she requires hospitalization.  CT of the chest shows no emergent conditions, but multiple issues that will need follow-up that I discussed with the daughter and printed her out a CT scan to share with the patient's provider for close follow-up of coronary artery disease, liver cyst with recommendation of abdominal ultrasound versus multiphase CT, evaluation for thickening of the stomach lining, as well as additional thyroid workup.  DIFFERENTIAL DIAGNOSIS: Differential diagnosis includes, but is not limited to, alcohol, illicit or prescription medications, or other toxic ingestion; intracranial pathology such as stroke or intracerebral hemorrhage; fever or infectious causes including sepsis; hypoxemia and/or hypercarbia; uremia; trauma; endocrine related disorders such as diabetes, hypoglycemia, and thyroid-related diseases; hypertensive encephalopathy; etc.   CONSULTATIONS:   None  Patient / Family / Caregiver informed of clinical course, medical  decision-making process, and agree with plan.   I discussed return precautions, follow-up instructions, and discharge instructions with patient and/or family.  Discharge Instructions : You are evaluated for wheezing and are being treated for asthma exacerbation with prednisone for the next 4 days, continue to take albuterol nebulizer every 4 hours as needed for wheezing.  Return to the emergency department immediately for any worsening breathing problems.  You are also evaluated for some change in mental status, and your exam and evaluation are overall reassuring.  You are found to have mild dehydration with mild acute kidney failure and was given IV fluids.  Your CT scan showed enlarged thyroid which will need follow-up with additional imaging, please discuss  with her primary care doctor.  Your CT scan shows coronary artery disease, discuss further risk management with your primary care doctor.  Your CT scan shows likely cyst on the liver, discussed with your primary care doctor regarding further evaluation with other imaging modalities.  Return to the emergency room immediately for any worsening condition including confusion or altered mental status one-sided weakness or numbness, fever, or any other symptoms concerning to you.     ___________________________________________   FINAL CLINICAL IMPRESSION(S) / ED DIAGNOSES   Final diagnoses:  Acute renal failure, unspecified acute renal failure type (HCC)  Dehydration  Exacerbation of asthma, unspecified asthma severity, unspecified whether persistent  Thyroid mass  Liver cyst      ___________________________________________        Note: This dictation was prepared with Dragon dictation. Any transcriptional errors that result from this process are unintentional    Governor RooksLord, Isador Castille, MD 03/22/17 1552

## 2017-03-22 NOTE — Discharge Instructions (Addendum)
You are evaluated for wheezing and are being treated for asthma exacerbation with prednisone for the next 4 days, continue to take albuterol nebulizer every 4 hours as needed for wheezing.  Return to the emergency department immediately for any worsening breathing problems.  You are also evaluated for some change in mental status, and your exam and evaluation are overall reassuring.  You are found to have mild dehydration with mild acute kidney failure and was given IV fluids.  Your CT scan showed enlarged thyroid which will need follow-up with additional imaging, please discuss with her primary care doctor.  Your CT scan shows coronary artery disease, discuss further risk management with your primary care doctor.  Your CT scan shows likely cyst on the liver, discussed with your primary care doctor regarding further evaluation with other imaging modalities.  Return to the emergency room immediately for any worsening condition including confusion or altered mental status one-sided weakness or numbness, fever, or any other symptoms concerning to you.

## 2017-03-22 NOTE — ED Notes (Signed)
Patient unable to sign. EMS transported back to facility.

## 2017-04-02 DIAGNOSIS — G309 Alzheimer's disease, unspecified: Secondary | ICD-10-CM | POA: Diagnosis not present

## 2017-04-02 DIAGNOSIS — R262 Difficulty in walking, not elsewhere classified: Secondary | ICD-10-CM | POA: Diagnosis not present

## 2017-04-02 DIAGNOSIS — F0280 Dementia in other diseases classified elsewhere without behavioral disturbance: Secondary | ICD-10-CM | POA: Diagnosis not present

## 2017-04-02 DIAGNOSIS — G459 Transient cerebral ischemic attack, unspecified: Secondary | ICD-10-CM | POA: Diagnosis not present

## 2017-04-04 DIAGNOSIS — I10 Essential (primary) hypertension: Secondary | ICD-10-CM | POA: Diagnosis not present

## 2017-04-04 DIAGNOSIS — E7800 Pure hypercholesterolemia, unspecified: Secondary | ICD-10-CM | POA: Diagnosis not present

## 2017-04-04 DIAGNOSIS — E039 Hypothyroidism, unspecified: Secondary | ICD-10-CM | POA: Diagnosis not present

## 2017-04-04 DIAGNOSIS — E010 Iodine-deficiency related diffuse (endemic) goiter: Secondary | ICD-10-CM | POA: Diagnosis not present

## 2017-04-07 DIAGNOSIS — E039 Hypothyroidism, unspecified: Secondary | ICD-10-CM | POA: Diagnosis not present

## 2017-04-07 DIAGNOSIS — E119 Type 2 diabetes mellitus without complications: Secondary | ICD-10-CM | POA: Diagnosis not present

## 2017-04-07 DIAGNOSIS — E7800 Pure hypercholesterolemia, unspecified: Secondary | ICD-10-CM | POA: Diagnosis not present

## 2017-04-16 DIAGNOSIS — J40 Bronchitis, not specified as acute or chronic: Secondary | ICD-10-CM | POA: Diagnosis not present

## 2017-04-16 DIAGNOSIS — G309 Alzheimer's disease, unspecified: Secondary | ICD-10-CM | POA: Diagnosis not present

## 2017-04-16 DIAGNOSIS — F0280 Dementia in other diseases classified elsewhere without behavioral disturbance: Secondary | ICD-10-CM | POA: Diagnosis not present

## 2017-04-16 DIAGNOSIS — N760 Acute vaginitis: Secondary | ICD-10-CM | POA: Diagnosis not present

## 2017-04-28 DIAGNOSIS — E010 Iodine-deficiency related diffuse (endemic) goiter: Secondary | ICD-10-CM | POA: Diagnosis not present

## 2017-06-11 IMAGING — CT CT HEAD W/O CM
5 of 9 series · 15 of 47 positions shown, 16 images · non-contrast
Comparison: None.

CLINICAL DATA: Fall from wheelchair with headaches and neck pain,
initial encounter

EXAM:
CT HEAD WITHOUT CONTRAST
CT CERVICAL SPINE WITHOUT CONTRAST
TECHNIQUE: Multidetector CT imaging of the head and cervical spine was
performed following the standard protocol without intravenous
contrast. Multiplanar CT image reconstructions of the cervical spine
were also generated.

[Series 2: ax head wo · axial · 0.32mm/px · z∈[-101,+24]mm · 3 of 29 slices shown, 4 images]
[im 1/29  brain]
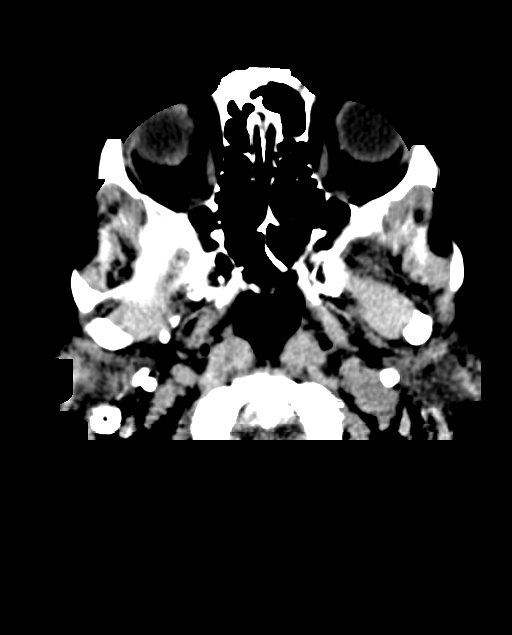
[im 1/29  bone]
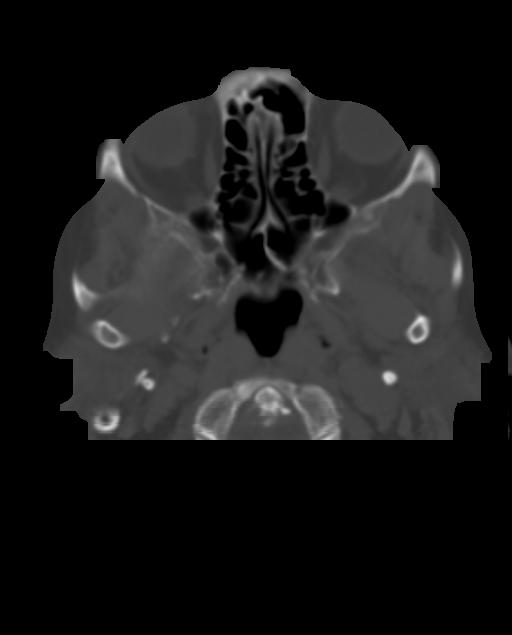
[im 15/29  brain]
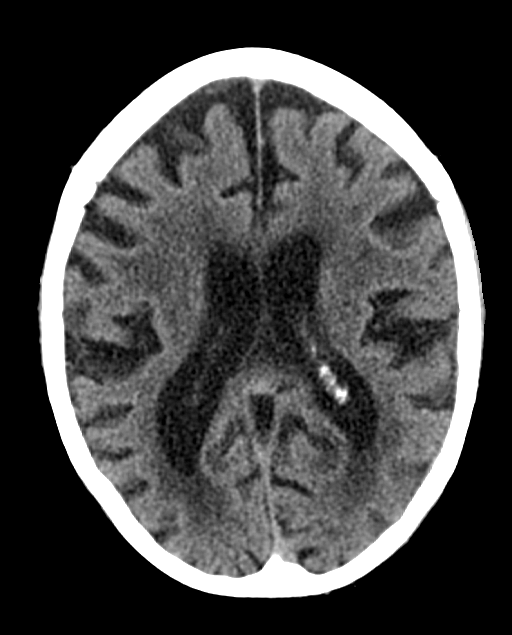
[im 29/29  brain]
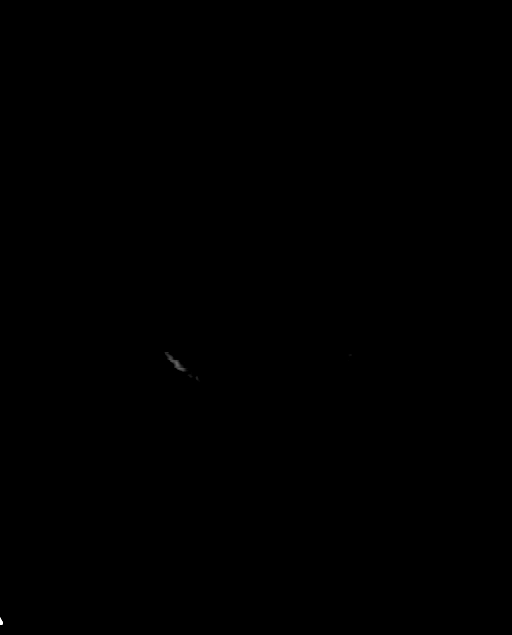

[Series 4: original bone · axial · 0.42mm/px · z∈[-117,-89]mm · 2 of 70 slices shown]
[im 14/70  bone]
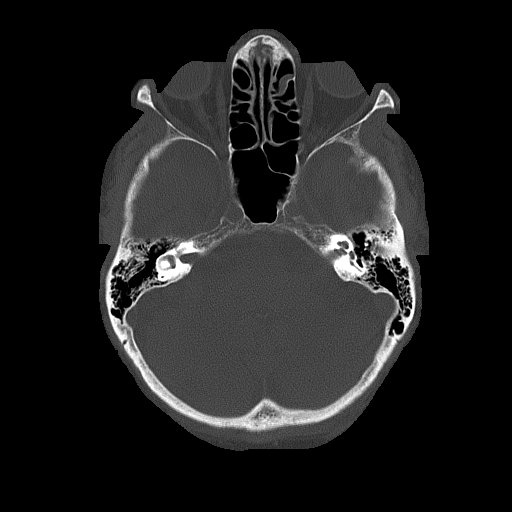
[im 28/70  bone]
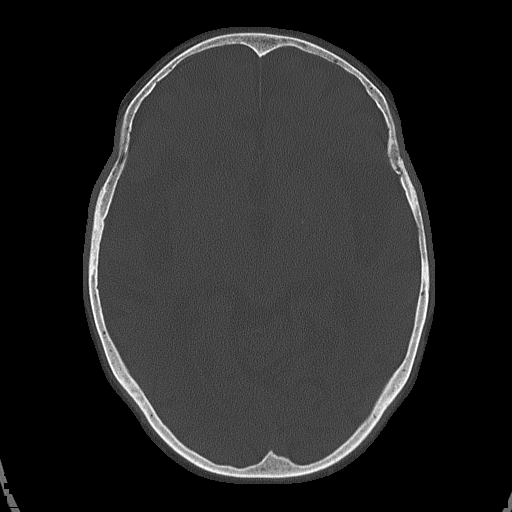

[Series 5: coronal soft tissue · coronal · 0.27mm/px · 3 of 67 slices shown]
[im 11/67  brain]
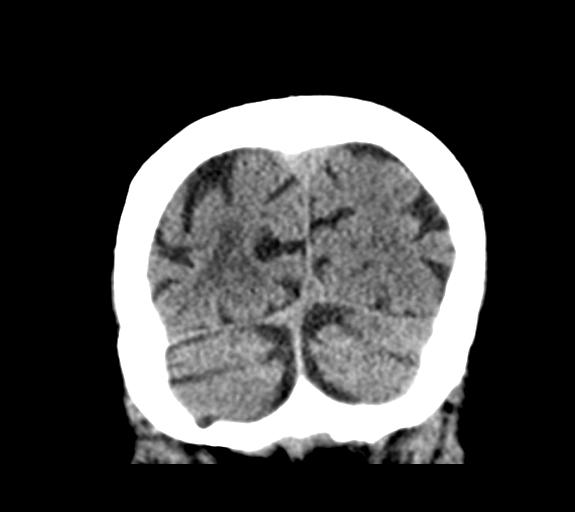
[im 15/67  brain]
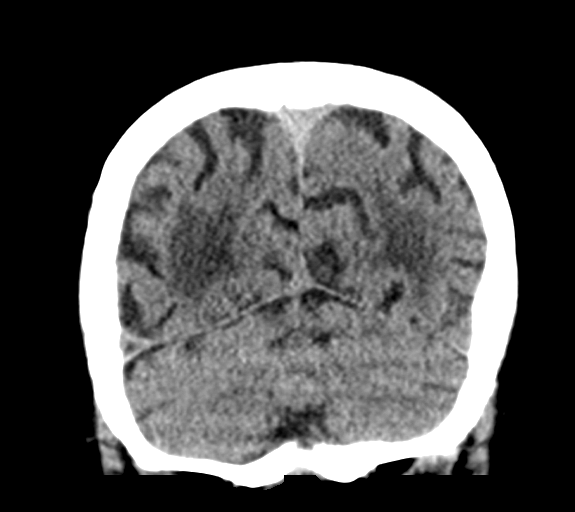
[im 18/67  brain]
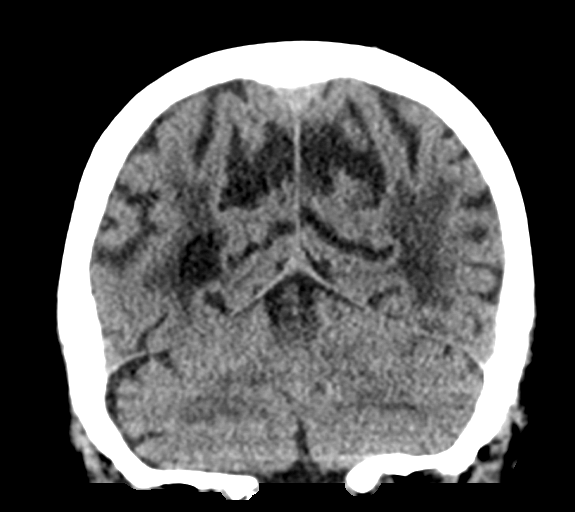

[Series 6: sagittal soft tissue · sagittal · 0.30mm/px · 2 of 52 slices shown]
[im 18/52  brain]
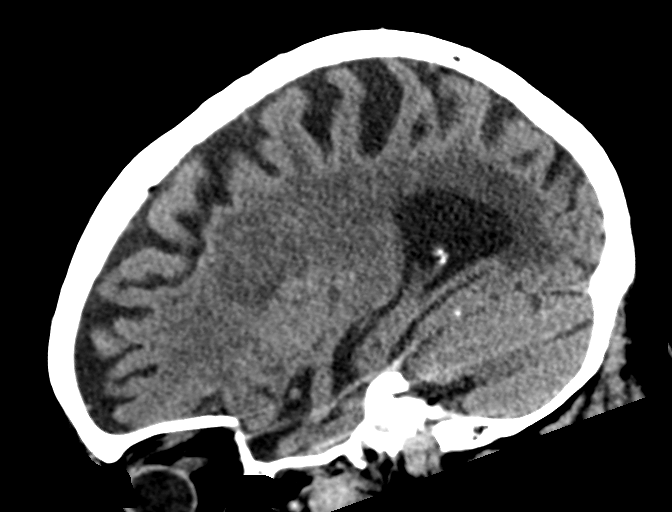
[im 35/52  brain]
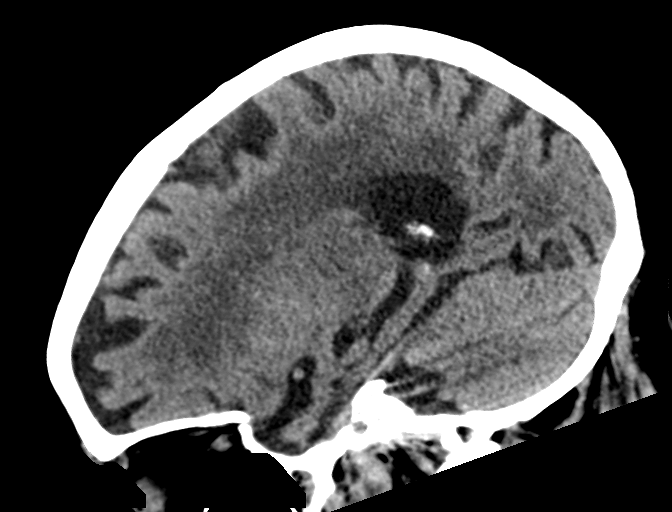

[Series 13: orthogonal bone · axial · 0.19mm/px · z∈[-299,-197]mm · 5 of 85 slices shown]
[im 15/85  bone]
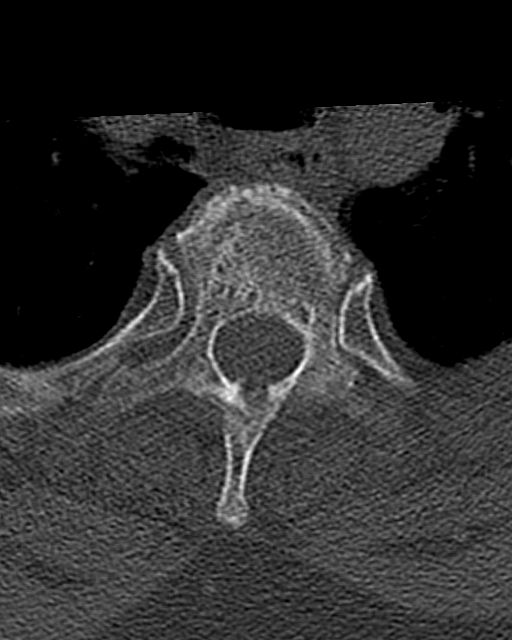
[im 29/85  bone]
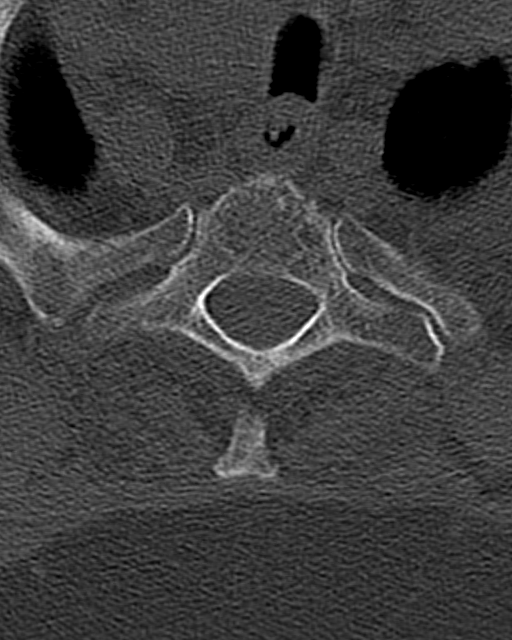
[im 43/85  bone]
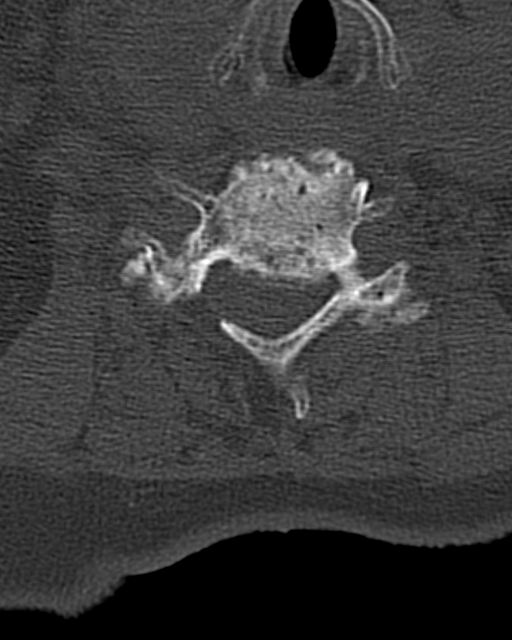
[im 57/85  bone]
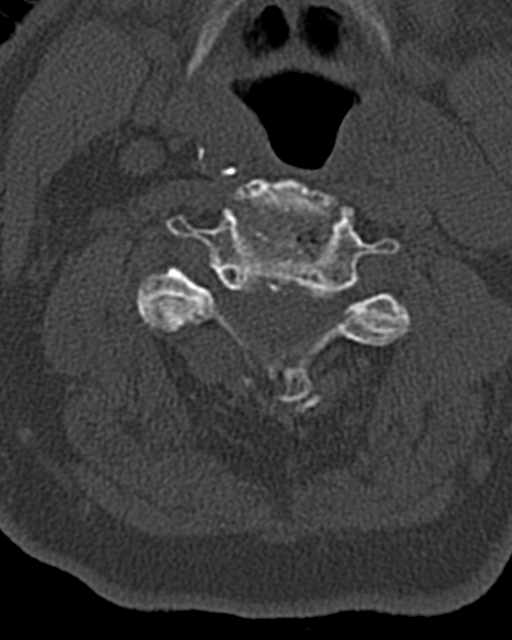
[im 71/85  bone]
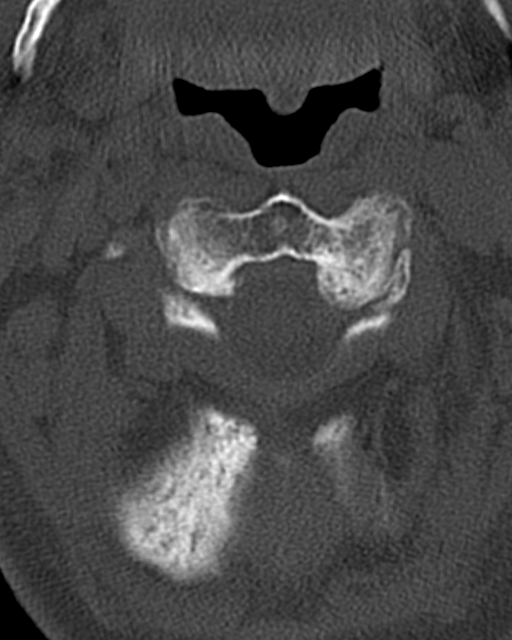

[15 of 47 positions shown; findings below may reference images not displayed]

FINDINGS: CT HEAD FINDINGS

Brain: No evidence of acute infarction, hemorrhage, hydrocephalus,
extra-axial collection or mass lesion/mass effect. Diffuse atrophic
changes and chronic white matter ischemic changes are noted. Basal
ganglia calcifications are seen.

Vascular: No hyperdense vessel or unexpected calcification.

Skull: Normal. Negative for fracture or focal lesion.

Sinuses/Orbits: No acute finding.

Other: None.

CT CERVICAL SPINE FINDINGS

Alignment: Mild anterolisthesis of C2 on C3 is noted of a
degenerative nature.

Skull base and vertebrae: Vertebral body height is well maintained.
Multilevel osteophytic changes and facet hypertrophic changes are
seen. No acute fracture is noted.

Soft tissues and spinal canal: 3.5 cm hypodense nodule is noted
within the right lobe of the thyroid. No other focal soft tissue
abnormality is noted.

Disc levels:  Multilevel disc space narrowing is noted from C3-C7.

Upper chest: Negative.
IMPRESSION: CT of the head: Chronic atrophic and ischemic changes without acute
abnormality.

CT of the cervical spine: Multilevel degenerative change without
acute abnormality.

Hypodense nodule within the right lobe of the thyroid as described.
The need for further nonemergent followup can be determined on a
clinical basis.

## 2017-08-11 DIAGNOSIS — G309 Alzheimer's disease, unspecified: Secondary | ICD-10-CM | POA: Diagnosis not present

## 2017-08-11 DIAGNOSIS — R399 Unspecified symptoms and signs involving the genitourinary system: Secondary | ICD-10-CM | POA: Diagnosis not present

## 2017-08-11 DIAGNOSIS — F0280 Dementia in other diseases classified elsewhere without behavioral disturbance: Secondary | ICD-10-CM | POA: Diagnosis not present

## 2017-08-11 DIAGNOSIS — R451 Restlessness and agitation: Secondary | ICD-10-CM | POA: Diagnosis not present

## 2017-08-19 DIAGNOSIS — E7800 Pure hypercholesterolemia, unspecified: Secondary | ICD-10-CM | POA: Diagnosis not present

## 2017-08-19 DIAGNOSIS — I10 Essential (primary) hypertension: Secondary | ICD-10-CM | POA: Diagnosis not present

## 2017-08-19 DIAGNOSIS — E119 Type 2 diabetes mellitus without complications: Secondary | ICD-10-CM | POA: Diagnosis not present

## 2017-08-19 DIAGNOSIS — M1009 Idiopathic gout, multiple sites: Secondary | ICD-10-CM | POA: Diagnosis not present

## 2017-08-27 DIAGNOSIS — N390 Urinary tract infection, site not specified: Secondary | ICD-10-CM | POA: Diagnosis not present

## 2017-10-11 ENCOUNTER — Encounter: Payer: Self-pay | Admitting: *Deleted

## 2017-10-11 ENCOUNTER — Emergency Department
Admission: EM | Admit: 2017-10-11 | Discharge: 2017-10-11 | Disposition: A | Discharge status: Skilled Nursing Facility | Payer: Medicare Other | Attending: Emergency Medicine | Admitting: Emergency Medicine

## 2017-10-11 ENCOUNTER — Emergency Department: Payer: Medicare Other

## 2017-10-11 ENCOUNTER — Other Ambulatory Visit: Payer: Self-pay

## 2017-10-11 DIAGNOSIS — I10 Essential (primary) hypertension: Secondary | ICD-10-CM | POA: Diagnosis not present

## 2017-10-11 DIAGNOSIS — Y92122 Bedroom in nursing home as the place of occurrence of the external cause: Secondary | ICD-10-CM | POA: Insufficient documentation

## 2017-10-11 DIAGNOSIS — M25532 Pain in left wrist: Secondary | ICD-10-CM | POA: Diagnosis not present

## 2017-10-11 DIAGNOSIS — M79642 Pain in left hand: Secondary | ICD-10-CM | POA: Diagnosis not present

## 2017-10-11 DIAGNOSIS — Y9389 Activity, other specified: Secondary | ICD-10-CM | POA: Insufficient documentation

## 2017-10-11 DIAGNOSIS — S6992XA Unspecified injury of left wrist, hand and finger(s), initial encounter: Secondary | ICD-10-CM | POA: Diagnosis not present

## 2017-10-11 DIAGNOSIS — S0081XA Abrasion of other part of head, initial encounter: Secondary | ICD-10-CM | POA: Insufficient documentation

## 2017-10-11 DIAGNOSIS — W06XXXA Fall from bed, initial encounter: Secondary | ICD-10-CM | POA: Diagnosis not present

## 2017-10-11 DIAGNOSIS — Y999 Unspecified external cause status: Secondary | ICD-10-CM | POA: Diagnosis not present

## 2017-10-11 DIAGNOSIS — M6281 Muscle weakness (generalized): Secondary | ICD-10-CM | POA: Diagnosis not present

## 2017-10-11 DIAGNOSIS — Z79899 Other long term (current) drug therapy: Secondary | ICD-10-CM | POA: Insufficient documentation

## 2017-10-11 DIAGNOSIS — W19XXXA Unspecified fall, initial encounter: Secondary | ICD-10-CM | POA: Diagnosis not present

## 2017-10-11 DIAGNOSIS — R51 Headache: Secondary | ICD-10-CM | POA: Diagnosis not present

## 2017-10-11 DIAGNOSIS — S0990XA Unspecified injury of head, initial encounter: Secondary | ICD-10-CM | POA: Diagnosis not present

## 2017-10-11 DIAGNOSIS — S60212A Contusion of left wrist, initial encounter: Secondary | ICD-10-CM | POA: Insufficient documentation

## 2017-10-11 DIAGNOSIS — Z7401 Bed confinement status: Secondary | ICD-10-CM | POA: Diagnosis not present

## 2017-10-11 DIAGNOSIS — F0390 Unspecified dementia without behavioral disturbance: Secondary | ICD-10-CM | POA: Insufficient documentation

## 2017-10-11 DIAGNOSIS — J45909 Unspecified asthma, uncomplicated: Secondary | ICD-10-CM | POA: Diagnosis not present

## 2017-10-11 NOTE — ED Triage Notes (Signed)
Pt brought in via ems from [facility]. Pt fell out of bed tonight.  Pt has hematoma to left side of forehead and left hand.  Pt alert.  Hx dementia

## 2017-10-11 NOTE — ED Notes (Signed)
Pt awake

## 2017-10-11 NOTE — Discharge Instructions (Signed)
It was a pleasure to take care of you today, and thank you for coming to our emergency department.  If you have any questions or concerns before leaving please ask the nurse to grab me and I'm more than happy to go through your aftercare instructions again.  If you were prescribed any opioid pain medication today such as Norco, Vicodin, Percocet, morphine, hydrocodone, or oxycodone please make sure you do not drive when you are taking this medication as it can alter your ability to drive safely.  If you have any concerns once you are home that you are not improving or are in fact getting worse before you can make it to your follow-up appointment, please do not hesitate to call [phone] and come back for further evaluation.  Merrily Brittle, MD  Results for orders placed or performed during the hospital encounter of 03/22/17  Basic metabolic panel  Result Value Ref Range   Sodium 140 135 - 145 mmol/L   Potassium 4.4 3.5 - 5.1 mmol/L   Chloride 99 (L) 101 - 111 mmol/L   CO2 33 (H) 22 - 32 mmol/L   Glucose, Bld 95 65 - 99 mg/dL   BUN 23 (H) 6 - 20 mg/dL   Creatinine, Ser 1.61 (H) 0.44 - 1.00 mg/dL   Calcium 9.8 8.9 - 09.6 mg/dL   GFR calc non Af Amer 41 (L) >60 mL/min   GFR calc Af Amer 47 (L) >60 mL/min   Anion gap 8 5 - 15  CBC with Differential  Result Value Ref Range   WBC 8.6 3.6 - 11.0 K/uL   RBC 4.26 3.80 - 5.20 MIL/uL   Hemoglobin 13.3 12.0 - 16.0 g/dL   HCT 04.5 40.9 - 81.1 %   MCV 93.5 80.0 - 100.0 fL   MCH 31.1 26.0 - 34.0 pg   MCHC 33.3 32.0 - 36.0 g/dL   RDW 91.4 (H) 78.2 - 95.6 %   Platelets 198 150 - 440 K/uL   Neutrophils Relative % 60 %   Neutro Abs 5.2 1.4 - 6.5 K/uL   Lymphocytes Relative 25 %   Lymphs Abs 2.1 1.0 - 3.6 K/uL   Monocytes Relative 10 %   Monocytes Absolute 0.8 0.2 - 0.9 K/uL   Eosinophils Relative 4 %   Eosinophils Absolute 0.3 0 - 0.7 K/uL   Basophils Relative 1 %   Basophils Absolute 0.1 0 - 0.1 K/uL  Urinalysis, Complete w Microscopic  Result  Value Ref Range   Color, Urine STRAW (A) YELLOW   APPearance CLEAR (A) CLEAR   Specific Gravity, Urine 1.010 1.005 - 1.030   pH 5.0 5.0 - 8.0   Glucose, UA NEGATIVE NEGATIVE mg/dL   Hgb urine dipstick NEGATIVE NEGATIVE   Bilirubin Urine NEGATIVE NEGATIVE   Ketones, ur NEGATIVE NEGATIVE mg/dL   Protein, ur NEGATIVE NEGATIVE mg/dL   Nitrite NEGATIVE NEGATIVE   Leukocytes, UA NEGATIVE NEGATIVE   RBC / HPF NONE SEEN 0 - 5 RBC/hpf   WBC, UA 0-5 0 - 5 WBC/hpf   Bacteria, UA NONE SEEN NONE SEEN   Squamous Epithelial / LPF 0-5 (A) NONE SEEN   Mucus PRESENT    Hyaline Casts, UA PRESENT    Dg Wrist Complete Left  Result Date: 10/11/2017 CLINICAL DATA:  Left hand and wrist pain after fall. Scaphoid irregularity and hand radiographs. EXAM: LEFT WRIST - COMPLETE 3+ VIEW COMPARISON:  Hand radiograph earlier this day. FINDINGS: No evidence of acute fracture, particularly, the question scaphoid irregularity  on hand radiograph felt to be secondary to positioning. Carpal bones appear intact with chondrocalcinosis. Overall alignment is maintained. Osteoarthritis at the base of the thumb. IMPRESSION: No fracture of the left wrist, questioned scaphoid irregularity and hand radiograph was positional. Electronically Signed   By: Rubye Oaks M.D.   On: 10/11/2017 05:05   Ct Head Wo Contrast  Result Date: 10/11/2017 CLINICAL DATA:  Dementia patient post fall out of bed tonight. Left forehead hematoma. EXAM: CT HEAD WITHOUT CONTRAST CT CERVICAL SPINE WITHOUT CONTRAST TECHNIQUE: Multidetector CT imaging of the head and cervical spine was performed following the standard protocol without intravenous contrast. Multiplanar CT image reconstructions of the cervical spine were also generated. COMPARISON:  Head CT 03/22/2017, head and cervical spine CT 03/04/2016 FINDINGS: CT HEAD FINDINGS Brain: Advanced atrophy and chronic small vessel ischemia, unchanged from prior exam. No intracranial hemorrhage, mass effect, or  midline shift. Partially calcified extra-axial lesion adjacent to the falx in the high frontal region measuring 11 mm in thickness with adjacent calvarial change, likely meningioma, unchanged from prior exams. No hydrocephalus. The basilar cisterns are patent. No evidence of territorial infarct or acute ischemia. No extra-axial or intracranial fluid collection. Vascular: Atherosclerosis of skullbase vasculature without hyperdense vessel or abnormal calcification. Skull: No acute fracture. Chronic area of calvarial coarsening about the right vertex/high frontal bone, with likely subjacent meningioma. Sinuses/Orbits: Paranasal sinuses and mastoid air cells are clear. The visualized orbits are unremarkable. Other: None. CT CERVICAL SPINE FINDINGS Alignment: 2 mm anterolisthesis of C2 on C3, unchanged. No traumatic subluxation. Skull base and vertebrae: No acute fracture. Dens and skull base are intact. Chronic flattening of C6 vertebral body likely degenerative. Soft tissues and spinal canal: No prevertebral fluid or swelling. No visible canal hematoma. Disc levels: Diffuse disc space narrowing and endplate spurring, advanced at multiple levels with near complete bony fusion at C4-C5. Prominent facet arthropathy at multiple levels. Varying degrees of neural foraminal stenosis. Upper chest: No acute finding. Other: Right thyroid nodule is unchanged from prior exam. IMPRESSION: 1. No acute intracranial abnormality. No skull fracture. Stable degree of atrophy and chronic small vessel ischemia. 2. Advanced degenerative change throughout cervical spine without acute fracture. Electronically Signed   By: Rubye Oaks M.D.   On: 10/11/2017 03:43   Ct Cervical Spine Wo Contrast  Result Date: 10/11/2017 CLINICAL DATA:  Dementia patient post fall out of bed tonight. Left forehead hematoma. EXAM: CT HEAD WITHOUT CONTRAST CT CERVICAL SPINE WITHOUT CONTRAST TECHNIQUE: Multidetector CT imaging of the head and cervical spine  was performed following the standard protocol without intravenous contrast. Multiplanar CT image reconstructions of the cervical spine were also generated. COMPARISON:  Head CT 03/22/2017, head and cervical spine CT 03/04/2016 FINDINGS: CT HEAD FINDINGS Brain: Advanced atrophy and chronic small vessel ischemia, unchanged from prior exam. No intracranial hemorrhage, mass effect, or midline shift. Partially calcified extra-axial lesion adjacent to the falx in the high frontal region measuring 11 mm in thickness with adjacent calvarial change, likely meningioma, unchanged from prior exams. No hydrocephalus. The basilar cisterns are patent. No evidence of territorial infarct or acute ischemia. No extra-axial or intracranial fluid collection. Vascular: Atherosclerosis of skullbase vasculature without hyperdense vessel or abnormal calcification. Skull: No acute fracture. Chronic area of calvarial coarsening about the right vertex/high frontal bone, with likely subjacent meningioma. Sinuses/Orbits: Paranasal sinuses and mastoid air cells are clear. The visualized orbits are unremarkable. Other: None. CT CERVICAL SPINE FINDINGS Alignment: 2 mm anterolisthesis of C2 on C3, unchanged. No traumatic  subluxation. Skull base and vertebrae: No acute fracture. Dens and skull base are intact. Chronic flattening of C6 vertebral body likely degenerative. Soft tissues and spinal canal: No prevertebral fluid or swelling. No visible canal hematoma. Disc levels: Diffuse disc space narrowing and endplate spurring, advanced at multiple levels with near complete bony fusion at C4-C5. Prominent facet arthropathy at multiple levels. Varying degrees of neural foraminal stenosis. Upper chest: No acute finding. Other: Right thyroid nodule is unchanged from prior exam. IMPRESSION: 1. No acute intracranial abnormality. No skull fracture. Stable degree of atrophy and chronic small vessel ischemia. 2. Advanced degenerative change throughout cervical  spine without acute fracture. Electronically Signed   By: Rubye OaksMelanie  Ehinger M.D.   On: 10/11/2017 03:43   Dg Hand Complete Left  Result Date: 10/11/2017 CLINICAL DATA:  Left hand pain after fall onto outstretched hand. EXAM: LEFT HAND - COMPLETE 3+ VIEW COMPARISON:  None. FINDINGS: Question cortical irregularity of the mid distal scaphoid, seen only on the AP view. Multifocal osteoarthritis, prominent at the thumb carpal metacarpal joint, with possible remote prior first metacarpal fracture. Chondrocalcinosis involving the triangular fibrocartilage. No radiopaque foreign body or localizing soft tissue abnormality. IMPRESSION: 1. Questioned cortical irregularity of the mid distal scaphoid, seen only on a single view. This is likely secondary positioning, however if there is focal tenderness, recommend dedicated wrist radiograph. 2. No other acute fracture. 3. Osteoarthritis, prominent at the thumb carpal metacarpal joint, with possible remote first metacarpal fracture. Electronically Signed   By: Rubye OaksMelanie  Ehinger M.D.   On: 10/11/2017 02:35

## 2017-10-11 NOTE — ED Notes (Signed)
Pt brought in via ems from [facility].  Pt fell out of the bed tonight.  Pt has hematoma to left hand and florehead. No loc.  No vomiting. Hx dementia.  Pt alert on arrival  Pt confused.  siderails up x 2

## 2017-10-11 NOTE — ED Notes (Signed)
Pt is unable to sign due to mental status 

## 2017-10-11 NOTE — ED Notes (Signed)
Report received from John, RN

## 2017-10-11 NOTE — ED Notes (Signed)
Report off to John rn

## 2017-10-11 NOTE — ED Provider Notes (Signed)
[facility] Emergency Department Provider Note  ____________________________________________   First MD Initiated Contact with Patient 10/11/17 0153     (approximate)  I have reviewed the triage vital signs and the nursing notes.   HISTORY  Chief Complaint Fall   HPI Stephanie Duarte is a 84 y.o. female who comes the emergency department via EMS after a mechanical fall while in bed tonight.  She said she rolled over and the next thing she knew she woke up on the floor.   She notes a sudden onset moderate severity headache and some mild discomfort to her left hand.  No chest pain shortness of breath abdominal pain nausea or vomiting.  Patient does have a significant past medical history of dementia.  Unknown tetanus status.  She says she did not pass out.   Past Medical History:  Diagnosis Date  . Asthma   . Dementia   . Hyperlipidemia   . Hypertension     Patient Active Problem List   Diagnosis Date Noted  . Fever 07/04/2015    No past surgical history on file.  Prior to Admission medications   Medication Sig Start Date End Date Taking? Authorizing Provider  acetaminophen (TYLENOL) 325 MG tablet Take 2 tablets (650 mg total) by mouth every 6 (six) hours as needed for mild pain. 07/07/15   Gouru, Deanna Artis, MD  albuterol (ACCUNEB) 0.63 MG/3ML nebulizer solution Take 1 ampule by nebulization 2 (two) times daily. Pt also uses every six hours as needed for wheezing.    [provider]  albuterol (PROVENTIL HFA;VENTOLIN HFA) 108 (90 Base) MCG/ACT inhaler Inhale 2 puffs into the lungs every 6 (six) hours as needed for wheezing or shortness of breath (and/or cough).    [provider]  amLODipine (NORVASC) 2.5 MG tablet Take 2.5 mg by mouth daily.    [provider]  aspirin 81 MG chewable tablet Chew 81 mg by mouth daily.    [provider]  bisacodyl (DULCOLAX) 5 MG EC tablet Take 1 tablet (5 mg total) by mouth daily as  needed for moderate constipation. 07/07/15   Ramonita Lab, MD  cholecalciferol (VITAMIN D) 1000 units tablet Take 4,000 Units by mouth daily.    [provider]  citalopram (CELEXA) 20 MG tablet Take 20 mg by mouth daily.    [provider]  docusate sodium (COLACE) 100 MG capsule Take 1 capsule (100 mg total) by mouth 2 (two) times daily. 07/07/15   Gouru, Deanna Artis, MD  donepezil (ARICEPT) 5 MG tablet Take 5 mg by mouth at bedtime.    [provider]  HYDROcodone-acetaminophen (NORCO/VICODIN) 5-325 MG tablet Take 1-2 tablets by mouth every 4 (four) hours as needed for moderate pain. 07/07/15   Gouru, Deanna Artis, MD  memantine (NAMENDA) 5 MG tablet Take 5 mg by mouth 2 (two) times daily.    [provider]  pravastatin (PRAVACHOL) 40 MG tablet Take 40 mg by mouth daily.    [provider]  predniSONE (DELTASONE) 10 MG tablet 40 mg once daily for 4 more days 03/22/17   Governor Rooks, MD    Allergies Patient has no known allergies.  No family history on file.  Social History Social History   Tobacco Use  . Smoking status: Never Smoker  . Smokeless tobacco: Never Used  Substance Use Topics  . Alcohol use: No  . Drug use: Not on file    Review of Systems Constitutional: No fever/chills Eyes: No visual changes. ENT:  No sore throat. Cardiovascular: Denies chest pain. Respiratory: Denies shortness of breath. Gastrointestinal: No abdominal pain.  No nausea, no vomiting.  No diarrhea.  No constipation. Genitourinary: Negative for dysuria. Musculoskeletal: Negative for back pain. Skin: Positive for wound Neurological: Negative for headaches, focal weakness or numbness.   ____________________________________________   PHYSICAL EXAM:  VITAL SIGNS: ED Triage Vitals  Enc Vitals Group     BP 10/11/17 0139 124/71     Pulse Rate 10/11/17 0139 73     Resp 10/11/17 0139 20     Temp 10/11/17 0139 98.6 F (37 C)     Temp Source 10/11/17 0139 Oral      SpO2 10/11/17 0139 93 %     Weight 10/11/17 0138 160 lb (72.6 kg)     Height 10/11/17 0138 5\' 4"  (1.626 m)     Head Circumference --      Peak Flow --      Pain Score --      Pain Loc --      Pain Edu? --      Excl. in GC? --     Constitutional: Clearly somewhat confused Eyes: PERRL EOMI. Head: Abrasion to left forehead. Nose: No congestion/rhinnorhea. Mouth/Throat: No trismus Neck: No stridor.   Cardiovascular: Normal rate, regular rhythm. Grossly normal heart sounds.  Good peripheral circulation. Respiratory: Normal respiratory effort.  No retractions. Lungs CTAB and moving good air Gastrointestinal: Soft nontender Musculoskeletal: Bruising to left posterior wrist Neurologic:  Normal speech and language. No gross focal neurologic deficits are appreciated. Skin:  Skin is warm, dry and intact. No rash noted. Psychiatric: Somewhat demented   ____________________________________________   DIFFERENTIAL includes but not limited to  Chemical fall, syncope, cardiogenic syncope, intracerebral hemorrhage, cervical spine fracture ____________________________________________   LABS (all labs ordered are listed, but only abnormal results are displayed)  Labs Reviewed - No data to display   __________________________________________  EKG  ___________  RADIOLOGY  Head and neck CTs reviewed by me with no acute disease Hand x-ray reviewed by me with possible fracture however wrist x-ray confirms this was artifact ____________________________________________   PROCEDURES  Procedure(s) performed: no  Procedures  Critical Care performed: no  ____________________________________________   INITIAL IMPRESSION / ASSESSMENT AND PLAN / ED COURSE  Pertinent labs & imaging results that were available during my care of the patient were reviewed by me and considered in my medical decision making (see chart for details).   As part of my medical decision making, I reviewed the  following data within the electronic MEDICAL RECORD NUMBER History obtained from family if available, nursing notes, old chart and ekg, as well as notes from prior ED visits.  The patient had a mechanical fall out of bed.  EKG reassuring and neuroimaging as well as wrist imaging is negative.  Kept on monitor multiple hours with no ectopy.  Stable for discharge back to her facility.      ____________________________________________   FINAL CLINICAL IMPRESSION(S) / ED DIAGNOSES  Final diagnoses:  Fall, initial encounter      NEW MEDICATIONS STARTED DURING THIS VISIT:  Discharge Medication List as of 10/11/2017  5:21 AM       Note:  This document was prepared using Dragon voice recognition software and may include unintentional dictation errors.     Merrily Brittleifenbark, Tyric Rodeheaver, MD 10/16/17 1028

## 2017-10-11 NOTE — ED Notes (Signed)
Pt resting at this time, warm blanket given. Pt is in NAD. Will continue to monitor.

## 2017-10-13 DIAGNOSIS — M79675 Pain in left toe(s): Secondary | ICD-10-CM | POA: Diagnosis not present

## 2017-10-13 DIAGNOSIS — E119 Type 2 diabetes mellitus without complications: Secondary | ICD-10-CM | POA: Diagnosis not present

## 2017-10-13 DIAGNOSIS — M79674 Pain in right toe(s): Secondary | ICD-10-CM | POA: Diagnosis not present

## 2017-10-13 DIAGNOSIS — B351 Tinea unguium: Secondary | ICD-10-CM | POA: Diagnosis not present

## 2017-10-14 DIAGNOSIS — E042 Nontoxic multinodular goiter: Secondary | ICD-10-CM | POA: Diagnosis not present

## 2017-10-29 DIAGNOSIS — E042 Nontoxic multinodular goiter: Secondary | ICD-10-CM | POA: Diagnosis not present

## 2018-01-20 DIAGNOSIS — R300 Dysuria: Secondary | ICD-10-CM | POA: Diagnosis not present

## 2018-03-12 ENCOUNTER — Inpatient Hospital Stay
Admission: EM | Admit: 2018-03-12 | Discharge: 2018-03-17 | DRG: 202 | Disposition: A | Discharge status: Nursing Home (Includes ICF) | Payer: Medicare Other | Attending: Internal Medicine | Admitting: Internal Medicine

## 2018-03-12 ENCOUNTER — Emergency Department: Payer: Medicare Other

## 2018-03-12 ENCOUNTER — Encounter: Payer: Self-pay | Admitting: *Deleted

## 2018-03-12 ENCOUNTER — Other Ambulatory Visit: Payer: Self-pay

## 2018-03-12 DIAGNOSIS — Z79890 Hormone replacement therapy (postmenopausal): Secondary | ICD-10-CM | POA: Diagnosis not present

## 2018-03-12 DIAGNOSIS — Z7401 Bed confinement status: Secondary | ICD-10-CM

## 2018-03-12 DIAGNOSIS — R7303 Prediabetes: Secondary | ICD-10-CM | POA: Diagnosis present

## 2018-03-12 DIAGNOSIS — J45901 Unspecified asthma with (acute) exacerbation: Secondary | ICD-10-CM | POA: Diagnosis not present

## 2018-03-12 DIAGNOSIS — R0602 Shortness of breath: Secondary | ICD-10-CM | POA: Diagnosis present

## 2018-03-12 DIAGNOSIS — N183 Chronic kidney disease, stage 3 (moderate): Secondary | ICD-10-CM | POA: Diagnosis present

## 2018-03-12 DIAGNOSIS — Z66 Do not resuscitate: Secondary | ICD-10-CM | POA: Diagnosis present

## 2018-03-12 DIAGNOSIS — E785 Hyperlipidemia, unspecified: Secondary | ICD-10-CM | POA: Diagnosis present

## 2018-03-12 DIAGNOSIS — J189 Pneumonia, unspecified organism: Secondary | ICD-10-CM | POA: Diagnosis not present

## 2018-03-12 DIAGNOSIS — J181 Lobar pneumonia, unspecified organism: Secondary | ICD-10-CM

## 2018-03-12 DIAGNOSIS — R918 Other nonspecific abnormal finding of lung field: Secondary | ICD-10-CM | POA: Diagnosis not present

## 2018-03-12 DIAGNOSIS — J209 Acute bronchitis, unspecified: Secondary | ICD-10-CM | POA: Diagnosis not present

## 2018-03-12 DIAGNOSIS — F0390 Unspecified dementia without behavioral disturbance: Secondary | ICD-10-CM | POA: Diagnosis present

## 2018-03-12 DIAGNOSIS — E039 Hypothyroidism, unspecified: Secondary | ICD-10-CM | POA: Diagnosis present

## 2018-03-12 DIAGNOSIS — R059 Cough, unspecified: Secondary | ICD-10-CM

## 2018-03-12 DIAGNOSIS — M109 Gout, unspecified: Secondary | ICD-10-CM | POA: Diagnosis present

## 2018-03-12 DIAGNOSIS — Z7982 Long term (current) use of aspirin: Secondary | ICD-10-CM

## 2018-03-12 DIAGNOSIS — Z79899 Other long term (current) drug therapy: Secondary | ICD-10-CM | POA: Diagnosis not present

## 2018-03-12 DIAGNOSIS — I4510 Unspecified right bundle-branch block: Secondary | ICD-10-CM | POA: Diagnosis not present

## 2018-03-12 DIAGNOSIS — T380X5A Adverse effect of glucocorticoids and synthetic analogues, initial encounter: Secondary | ICD-10-CM | POA: Diagnosis present

## 2018-03-12 DIAGNOSIS — Z993 Dependence on wheelchair: Secondary | ICD-10-CM

## 2018-03-12 DIAGNOSIS — Z7984 Long term (current) use of oral hypoglycemic drugs: Secondary | ICD-10-CM

## 2018-03-12 DIAGNOSIS — R197 Diarrhea, unspecified: Secondary | ICD-10-CM | POA: Diagnosis not present

## 2018-03-12 DIAGNOSIS — I10 Essential (primary) hypertension: Secondary | ICD-10-CM | POA: Diagnosis not present

## 2018-03-12 DIAGNOSIS — I129 Hypertensive chronic kidney disease with stage 1 through stage 4 chronic kidney disease, or unspecified chronic kidney disease: Secondary | ICD-10-CM | POA: Diagnosis present

## 2018-03-12 DIAGNOSIS — R0789 Other chest pain: Secondary | ICD-10-CM | POA: Diagnosis not present

## 2018-03-12 DIAGNOSIS — J9601 Acute respiratory failure with hypoxia: Secondary | ICD-10-CM | POA: Diagnosis not present

## 2018-03-12 DIAGNOSIS — Z886 Allergy status to analgesic agent status: Secondary | ICD-10-CM

## 2018-03-12 DIAGNOSIS — J9811 Atelectasis: Secondary | ICD-10-CM | POA: Diagnosis not present

## 2018-03-12 DIAGNOSIS — R05 Cough: Secondary | ICD-10-CM

## 2018-03-12 LAB — CBC
HCT: 43.9 % (ref 36.0–46.0)
HEMOGLOBIN: 13.6 g/dL (ref 12.0–15.0)
MCH: 30.3 pg (ref 26.0–34.0)
MCHC: 31 g/dL (ref 30.0–36.0)
MCV: 97.8 fL (ref 80.0–100.0)
Platelets: 205 10*3/uL (ref 150–400)
RBC: 4.49 MIL/uL (ref 3.87–5.11)
RDW: 14.6 % (ref 11.5–15.5)
WBC: 8.9 10*3/uL (ref 4.0–10.5)
nRBC: 0 % (ref 0.0–0.2)

## 2018-03-12 LAB — GLUCOSE, CAPILLARY: Glucose-Capillary: 251 mg/dL — ABNORMAL HIGH (ref 70–99)

## 2018-03-12 LAB — INFLUENZA PANEL BY PCR (TYPE A & B)
Influenza A By PCR: NEGATIVE
Influenza B By PCR: NEGATIVE

## 2018-03-12 LAB — HEMOGLOBIN A1C
Hgb A1c MFr Bld: 6.7 % — ABNORMAL HIGH (ref 4.8–5.6)
Mean Plasma Glucose: 145.59 mg/dL

## 2018-03-12 LAB — BASIC METABOLIC PANEL
Anion gap: 9 (ref 5–15)
BUN: 21 mg/dL (ref 8–23)
CALCIUM: 9.3 mg/dL (ref 8.9–10.3)
CO2: 29 mmol/L (ref 22–32)
Chloride: 102 mmol/L (ref 98–111)
Creatinine, Ser: 1.13 mg/dL — ABNORMAL HIGH (ref 0.44–1.00)
GFR calc Af Amer: 51 mL/min — ABNORMAL LOW (ref >60)
GFR, EST NON AFRICAN AMERICAN: 44 mL/min — AB (ref >60)
GLUCOSE: 154 mg/dL — AB (ref 70–99)
POTASSIUM: 4.4 mmol/L (ref 3.5–5.1)
Sodium: 140 mmol/L (ref 135–145)

## 2018-03-12 LAB — TROPONIN I

## 2018-03-12 MED ORDER — ENOXAPARIN SODIUM 40 MG/0.4ML ~~LOC~~ SOLN
40.0000 mg | SUBCUTANEOUS | Status: DC
  Administered 2018-03-12 – 2018-03-16 (×5): 40 mg via SUBCUTANEOUS
  Filled 2018-03-12 (×5): qty 0.4

## 2018-03-12 MED ORDER — ACETAMINOPHEN 650 MG RE SUPP: 650.0000 mg | Freq: Four times a day (QID) | RECTAL | Status: DC | PRN

## 2018-03-12 MED ORDER — ALBUTEROL SULFATE (2.5 MG/3ML) 0.083% IN NEBU: 2.5000 mg | INHALATION_SOLUTION | RESPIRATORY_TRACT | Status: DC | PRN

## 2018-03-12 MED ORDER — IPRATROPIUM-ALBUTEROL 0.5-2.5 (3) MG/3ML IN SOLN: 3.0000 mL | Freq: Four times a day (QID) | RESPIRATORY_TRACT | Status: DC

## 2018-03-12 MED ORDER — ALLOPURINOL 100 MG PO TABS
100.0000 mg | ORAL_TABLET | Freq: Every day | ORAL | Status: DC
  Administered 2018-03-14 – 2018-03-17 (×4): 100 mg via ORAL
  Filled 2018-03-12 (×5): qty 1

## 2018-03-12 MED ORDER — POLYETHYLENE GLYCOL 3350 17 G PO PACK
17.0000 g | PACK | Freq: Every day | ORAL | Status: DC | PRN
  Administered 2018-03-13 – 2018-03-15 (×2): 17 g via ORAL
  Filled 2018-03-12 (×2): qty 1

## 2018-03-12 MED ORDER — DONEPEZIL HCL 5 MG PO TABS
5.0000 mg | ORAL_TABLET | Freq: Every day | ORAL | Status: DC
  Administered 2018-03-12 – 2018-03-16 (×5): 5 mg via ORAL
  Filled 2018-03-12 (×6): qty 1

## 2018-03-12 MED ORDER — GUAIFENESIN-DM 100-10 MG/5ML PO SYRP: 5.0000 mL | ORAL_SOLUTION | Freq: Four times a day (QID) | ORAL | Status: DC | PRN

## 2018-03-12 MED ORDER — SODIUM CHLORIDE 0.9 % IV SOLN
500.0000 mg | Freq: Once | INTRAVENOUS | Status: AC
  Administered 2018-03-12: 500 mg via INTRAVENOUS
  Filled 2018-03-12: qty 500

## 2018-03-12 MED ORDER — POTASSIUM CHLORIDE CRYS ER 10 MEQ PO TBCR
10.0000 meq | EXTENDED_RELEASE_TABLET | Freq: Every day | ORAL | Status: DC
  Administered 2018-03-14 – 2018-03-17 (×4): 10 meq via ORAL
  Filled 2018-03-12 (×5): qty 1

## 2018-03-12 MED ORDER — BISACODYL 5 MG PO TBEC: 5.0000 mg | DELAYED_RELEASE_TABLET | Freq: Every day | ORAL | Status: DC | PRN

## 2018-03-12 MED ORDER — ACETAMINOPHEN 325 MG PO TABS
650.0000 mg | ORAL_TABLET | Freq: Four times a day (QID) | ORAL | Status: DC | PRN
  Administered 2018-03-15: 650 mg via ORAL
  Filled 2018-03-12: qty 2

## 2018-03-12 MED ORDER — FUROSEMIDE 20 MG PO TABS
20.0000 mg | ORAL_TABLET | Freq: Every day | ORAL | Status: DC
  Administered 2018-03-14 – 2018-03-17 (×4): 20 mg via ORAL
  Filled 2018-03-12 (×5): qty 1

## 2018-03-12 MED ORDER — INSULIN ASPART 100 UNIT/ML ~~LOC~~ SOLN
0.0000 [IU] | Freq: Three times a day (TID) | SUBCUTANEOUS | Status: DC
  Administered 2018-03-13: 3 [IU] via SUBCUTANEOUS
  Administered 2018-03-13: 2 [IU] via SUBCUTANEOUS
  Administered 2018-03-13: 3 [IU] via SUBCUTANEOUS
  Administered 2018-03-14: 2 [IU] via SUBCUTANEOUS
  Administered 2018-03-14: 5 [IU] via SUBCUTANEOUS
  Administered 2018-03-14: 2 [IU] via SUBCUTANEOUS
  Administered 2018-03-15 (×2): 3 [IU] via SUBCUTANEOUS
  Administered 2018-03-16: 1 [IU] via SUBCUTANEOUS
  Administered 2018-03-16 (×2): 2 [IU] via SUBCUTANEOUS
  Administered 2018-03-17: 0 [IU] via SUBCUTANEOUS
  Administered 2018-03-17: 2 [IU] via SUBCUTANEOUS
  Filled 2018-03-12 (×11): qty 1

## 2018-03-12 MED ORDER — POLYETHYLENE GLYCOL 3350 17 GM/SCOOP PO POWD
17.0000 g | Freq: Every day | ORAL | Status: DC
  Administered 2018-03-15: 17 g via ORAL
  Filled 2018-03-12: qty 255

## 2018-03-12 MED ORDER — ASPIRIN 81 MG PO CHEW
81.0000 mg | CHEWABLE_TABLET | Freq: Every day | ORAL | Status: DC
  Administered 2018-03-13 – 2018-03-17 (×5): 81 mg via ORAL
  Filled 2018-03-12 (×5): qty 1

## 2018-03-12 MED ORDER — ALBUTEROL SULFATE (2.5 MG/3ML) 0.083% IN NEBU
2.5000 mg | INHALATION_SOLUTION | Freq: Once | RESPIRATORY_TRACT | Status: AC
  Administered 2018-03-12: 2.5 mg via RESPIRATORY_TRACT
  Filled 2018-03-12: qty 3

## 2018-03-12 MED ORDER — SODIUM CHLORIDE 0.9 % IV SOLN
2.0000 g | Freq: Once | INTRAVENOUS | Status: AC
  Administered 2018-03-12: 2 g via INTRAVENOUS
  Filled 2018-03-12: qty 20

## 2018-03-12 MED ORDER — METHYLPREDNISOLONE SODIUM SUCC 125 MG IJ SOLR
125.0000 mg | INTRAMUSCULAR | Status: AC
  Administered 2018-03-12: 125 mg via INTRAVENOUS
  Filled 2018-03-12: qty 2

## 2018-03-12 MED ORDER — IPRATROPIUM-ALBUTEROL 0.5-2.5 (3) MG/3ML IN SOLN
3.0000 mL | Freq: Once | RESPIRATORY_TRACT | Status: AC
  Administered 2018-03-12: 3 mL via RESPIRATORY_TRACT
  Filled 2018-03-12: qty 3

## 2018-03-12 MED ORDER — ONDANSETRON HCL 4 MG/2ML IJ SOLN: 4.0000 mg | Freq: Four times a day (QID) | INTRAMUSCULAR | Status: DC | PRN

## 2018-03-12 MED ORDER — MEMANTINE HCL 5 MG PO TABS
10.0000 mg | ORAL_TABLET | Freq: Two times a day (BID) | ORAL | Status: DC
  Administered 2018-03-12 – 2018-03-17 (×9): 10 mg via ORAL
  Filled 2018-03-12 (×11): qty 2

## 2018-03-12 MED ORDER — PRAVASTATIN SODIUM 20 MG PO TABS
40.0000 mg | ORAL_TABLET | Freq: Every day | ORAL | Status: DC
  Administered 2018-03-14 – 2018-03-17 (×4): 40 mg via ORAL
  Filled 2018-03-12 (×6): qty 2

## 2018-03-12 MED ORDER — IPRATROPIUM-ALBUTEROL 0.5-2.5 (3) MG/3ML IN SOLN
3.0000 mL | Freq: Four times a day (QID) | RESPIRATORY_TRACT | Status: DC
  Administered 2018-03-13 – 2018-03-14 (×5): 3 mL via RESPIRATORY_TRACT
  Filled 2018-03-12 (×5): qty 3

## 2018-03-12 MED ORDER — SODIUM CHLORIDE 0.9 % IV SOLN
500.0000 mg | INTRAVENOUS | Status: DC
  Administered 2018-03-13 – 2018-03-16 (×4): 500 mg via INTRAVENOUS
  Filled 2018-03-12 (×4): qty 500

## 2018-03-12 MED ORDER — LEVOTHYROXINE SODIUM 50 MCG PO TABS
50.0000 ug | ORAL_TABLET | Freq: Every day | ORAL | Status: DC
  Administered 2018-03-13 – 2018-03-17 (×5): 50 ug via ORAL
  Filled 2018-03-12 (×5): qty 1

## 2018-03-12 MED ORDER — CITALOPRAM HYDROBROMIDE 20 MG PO TABS
20.0000 mg | ORAL_TABLET | Freq: Every day | ORAL | Status: DC
  Administered 2018-03-14 – 2018-03-17 (×4): 20 mg via ORAL
  Filled 2018-03-12 (×5): qty 1

## 2018-03-12 MED ORDER — METHYLPREDNISOLONE SODIUM SUCC 125 MG IJ SOLR
60.0000 mg | Freq: Two times a day (BID) | INTRAMUSCULAR | Status: DC
  Administered 2018-03-13 – 2018-03-14 (×3): 60 mg via INTRAVENOUS
  Filled 2018-03-12 (×3): qty 2

## 2018-03-12 MED ORDER — SODIUM CHLORIDE 0.9 % IV SOLN
1.0000 g | INTRAVENOUS | Status: DC
  Administered 2018-03-13 – 2018-03-17 (×5): 1 g via INTRAVENOUS
  Filled 2018-03-12: qty 10
  Filled 2018-03-12 (×4): qty 1

## 2018-03-12 MED ORDER — ONDANSETRON HCL 4 MG PO TABS: 4.0000 mg | ORAL_TABLET | Freq: Four times a day (QID) | ORAL | Status: DC | PRN

## 2018-03-12 NOTE — H&P (Signed)
[facility] - [place] at [facility]   PATIENT NAME: Stephanie Duarte    MR#:  [id number]  DATE OF BIRTH:  03/01/1929  DATE OF ADMISSION:  03/12/2018  PRIMARY CARE PHYSICIAN: Raynelle Bring   REQUESTING/REFERRING PHYSICIAN: Dr. Fanny Bien  CHIEF COMPLAINT:   Chief Complaint  Patient presents with  . Shortness of Breath    HISTORY OF PRESENT ILLNESS:  Stephanie Duarte  is a 85 y.o. female with a known history of asthma, hypertension, dementia presents from assisted living facility due to cough, shortness of breath and weakness.  Patient does have baseline dementia.  Does not ambulate.  Is bedbound and wheelchair-bound.  Here in the emergency room she has been found to have saturations in the 80s on room air.  Given multiple nebulizers and IV steroids for wheezing.  Chest x-ray shows left lower lobe pneumonia.  Patient is being admitted to the hospital. Patient with dementia unable to contribute to history.  Is confused.  Daughter provided history.  Old records reviewed.  PAST MEDICAL HISTORY:   Past Medical History:  Diagnosis Date  . Asthma   . Dementia (HCC)   . Hyperlipidemia   . Hypertension     PAST SURGICAL HISTORY:  No past surgical history on file.  SOCIAL HISTORY:   Social History   Tobacco Use  . Smoking status: Never Smoker  . Smokeless tobacco: Never Used  Substance Use Topics  . Alcohol use: No    FAMILY HISTORY:  No family history on file. Unobtainable due to patient's dementia  DRUG ALLERGIES:   Allergies  Allergen Reactions  . Meloxicam Swelling    REVIEW OF SYSTEMS:   Review of Systems  Unable to perform ROS: Dementia    MEDICATIONS AT HOME:   Prior to Admission medications   Medication Sig Start Date End Date Taking? Authorizing Provider  acetaminophen (TYLENOL) 325 MG tablet Take 2 tablets (650 mg total) by mouth every 6 (six) hours as needed for mild pain. 07/07/15  Yes Gouru, Deanna Artis, MD  albuterol (ACCUNEB) 0.63 MG/3ML  nebulizer solution Take 1 ampule by nebulization 2 (two) times daily. Pt also uses every six hours as needed for wheezing.   Yes [provider]  allopurinol (ZYLOPRIM) 100 MG tablet Take 100 mg by mouth daily.   Yes [provider]  aspirin 81 MG chewable tablet Chew 81 mg by mouth daily.   Yes [provider]  bisacodyl (DULCOLAX) 5 MG EC tablet Take 1 tablet (5 mg total) by mouth daily as needed for moderate constipation. 07/07/15  Yes Gouru, Deanna Artis, MD  cholecalciferol (VITAMIN D) 1000 units tablet Take 1,000 Units by mouth daily.    Yes [provider]  citalopram (CELEXA) 20 MG tablet Take 20 mg by mouth daily.   Yes [provider]  donepezil (ARICEPT) 5 MG tablet Take 5 mg by mouth at bedtime.   Yes [provider]  furosemide (LASIX) 20 MG tablet Take 20 mg by mouth daily.   Yes [provider]  guaiFENesin-dextromethorphan (ROBITUSSIN DM) 100-10 MG/5ML syrup Take 5 mLs by mouth every 6 (six) hours as needed.   Yes [provider]  levothyroxine (SYNTHROID, LEVOTHROID) 50 MCG tablet Take 50 mcg by mouth every morning.   Yes [provider]  memantine (NAMENDA) 10 MG tablet Take 10 mg by mouth 2 (two) times daily.    Yes [provider]  metFORMIN (GLUCOPHAGE) 500 MG tablet Take 500 mg by mouth daily.   Yes [provider]  Multiple Vitamins-Minerals (PRESERVISION AREDS) CAPS Take 1 capsule by mouth 2 (two) times daily.   Yes [provider]  polyethylene glycol powder (GLYCOLAX/MIRALAX) powder Take 17 g by mouth daily.   Yes [provider]  potassium chloride (K-DUR) 10 MEQ tablet Take 10 mEq by mouth daily.   Yes [provider]  pravastatin (PRAVACHOL) 40 MG tablet Take 40 mg by mouth daily.   Yes [provider]  Skin Protectants, Misc. (EUCERIN) cream Apply topically 4 (four) times daily as needed.   Yes [provider]     VITAL SIGNS:  Blood  pressure 132/75, pulse 86, temperature 98.4 F (36.9 C), temperature source Oral, resp. rate 19, height 5\' 3"  (1.6 m), weight 74.8 kg, SpO2 99 %.  PHYSICAL EXAMINATION:  Physical Exam  GENERAL:  85 y.o.-year-old patient lying in the bed with respiratory distress EYES: Pupils equal, round, reactive to light and accommodation. No scleral icterus. Extraocular muscles intact.  HEENT: Head atraumatic, normocephalic. Oropharynx and nasopharynx clear. No oropharyngeal erythema, moist oral mucosa  NECK:  Supple, no jugular venous distention. No thyroid enlargement, no tenderness.  LUNGS: Bilateral wheezing and decreased air entry CARDIOVASCULAR: S1, S2 normal. No murmurs, rubs, or gallops.  ABDOMEN: Soft, nontender, nondistended. Bowel sounds present. No organomegaly or mass.  EXTREMITIES: No pedal edema, cyanosis, or clubbing. + 2 pedal & radial pulses b/l.   NEUROLOGIC: Cranial nerves II through XII are intact. No focal Motor or sensory deficits appreciated b/l PSYCHIATRIC: The patient is alert and awake.  Agitated at this time due to IV line being placed SKIN: No obvious rash, lesion, or ulcer.   LABORATORY PANEL:   CBC Recent Labs  Lab 03/12/18 1648  WBC 8.9  HGB 13.6  HCT 43.9  PLT 205   ------------------------------------------------------------------------------------------------------------------  Chemistries  Recent Labs  Lab 03/12/18 1648  NA 140  K 4.4  CL 102  CO2 29  GLUCOSE 154*  BUN 21  CREATININE 1.13*  CALCIUM 9.3   ------------------------------------------------------------------------------------------------------------------  Cardiac Enzymes Recent Labs  Lab 03/12/18 1648  TROPONINI <0.03   ------------------------------------------------------------------------------------------------------------------  RADIOLOGY:  Dg Chest Portable 1 View  Result Date: 03/12/2018 CLINICAL DATA:  Dyspnea EXAM: PORTABLE CHEST 1 VIEW COMPARISON:  03/22/2017, CT  03/22/2017 FINDINGS: Probable small left pleural effusion. Hazy opacity at the left lung base. Mild cardiomegaly with aortic atherosclerosis. Right paratracheal opacity, corresponding to thyroid mass. No pneumothorax. IMPRESSION: 1. Mild cardiomegaly 2. Probable small left effusion with hazy atelectasis or infiltrate at the left base Electronically Signed   By: Jasmine PangKim  Fujinaga M.D.   On: 03/12/2018 17:12     IMPRESSION AND PLAN:   *Left lower lobe pneumonia with acute hypoxic respiratory failure Start patient on IV ceftriaxone and azithromycin.  Sputum cultures ordered.  Blood culture sent from emergency room.  Nebulizers scheduled.  Patient has significant wheezing and will also start her on IV steroids.  Wean oxygen as tolerated to keep saturations over 90%. Influenza checked and negative  *Asthma exacerbation secondary to pneumonia.  See above. Continue home inhalers  *Dementia.  Monitor for inpatient delirium.  *Hypertension.  We will continue home medications.  *CKD stage III is stable  All the records are reviewed and case discussed with ED provider. Management plans discussed with the patient, family and they are in agreement.  CODE STATUS: DNR/DNI  TOTAL TIME TAKING CARE OF THIS PATIENT: 35 minutes.   Molinda BailiffSrikar R Revecca Nachtigal M.D on 03/12/2018 at 7:58 PM  Between 7am to 6pm -  Pager - [phone]  After 6pm go to [email or web address] - password [email or web address]  [facility] [place] [facility]  Office  [phone]  CC: Primary care physician; Raynelle Bring  Note: This dictation was prepared with Dragon dictation along with smaller phrase technology. Any transcriptional errors that result from this process are unintentional.

## 2018-03-12 NOTE — Progress Notes (Signed)
Advance care planning  Purpose of Encounter Pneumonia, asthma  Parties in Attendance Patient and her daughter who is the healthcare power of attorney- Ingle,Mitzi  Patients Decisional capacity Patient with advanced dementia unable to make medical decisions.  Daughter is the healthcare power of attorney and is at bedside.  Discussed regarding pneumonia, treatment plan and prognosis.  We discussed regarding patient's advanced age and CODE STATUS.  Daughter would like patient to be DO NOT RESUSCITATE and do not intubate.  Orders entered and CODE STATUS changed.  Time spent - 17 minutes

## 2018-03-12 NOTE — ED Provider Notes (Signed)
[facility] Emergency Department Provider Note   ____________________________________________   First MD Initiated Contact with Patient 03/12/18 1645     (approximate)  I have reviewed the triage vital signs and the nursing notes.   HISTORY  Chief Complaint Shortness of Breath  EM caveat the patient has severe dementia, history is primarily obtained from the patient's daughter  HPI Tauri Fandrich Depaola is a 85 y.o. female for evaluation of wheezing   Daughter reports about a couple days now they have noticed she has a slight cough at her care home.  Present today the daughter received a call, care home reported she was wheezing quite a bit more.  They called her primary doctor, they could not be seen today, went to urgent care and was referred to the ER due to her increased work of breathing  The patient reports she does feel short of breath and a little tight in her chest.  No known fevers.  Denies "chest pain".  Has leg swelling, but does not appear to be any worse than her normal  She does have a frequent dry cough.  Increased wheezing and shortness of breath today.  Daughter reports they do treat her with nebulizers for what they suspect is some type of asthma but is also heard the word COPD potentially mention  Past Medical History:  Diagnosis Date  . Asthma   . Dementia (HCC)   . Hyperlipidemia   . Hypertension     Patient Active Problem List   Diagnosis Date Noted  . Fever 07/04/2015    No past surgical history on file.  Prior to Admission medications   Medication Sig Start Date End Date Taking? Authorizing Provider  acetaminophen (TYLENOL) 325 MG tablet Take 2 tablets (650 mg total) by mouth every 6 (six) hours as needed for mild pain. 07/07/15   Gouru, Deanna Artis, MD  albuterol (ACCUNEB) 0.63 MG/3ML nebulizer solution Take 1 ampule by nebulization 2 (two) times daily. Pt also uses every six hours as needed for wheezing.    [provider]    albuterol (PROVENTIL HFA;VENTOLIN HFA) 108 (90 Base) MCG/ACT inhaler Inhale 2 puffs into the lungs every 6 (six) hours as needed for wheezing or shortness of breath (and/or cough).    [provider]  amLODipine (NORVASC) 2.5 MG tablet Take 2.5 mg by mouth daily.    [provider]  aspirin 81 MG chewable tablet Chew 81 mg by mouth daily.    [provider]  bisacodyl (DULCOLAX) 5 MG EC tablet Take 1 tablet (5 mg total) by mouth daily as needed for moderate constipation. 07/07/15   Ramonita Lab, MD  cholecalciferol (VITAMIN D) 1000 units tablet Take 4,000 Units by mouth daily.    [provider]  citalopram (CELEXA) 20 MG tablet Take 20 mg by mouth daily.    [provider]  docusate sodium (COLACE) 100 MG capsule Take 1 capsule (100 mg total) by mouth 2 (two) times daily. 07/07/15   Gouru, Deanna Artis, MD  donepezil (ARICEPT) 5 MG tablet Take 5 mg by mouth at bedtime.    [provider]  HYDROcodone-acetaminophen (NORCO/VICODIN) 5-325 MG tablet Take 1-2 tablets by mouth every 4 (four) hours as needed for moderate pain. 07/07/15   Gouru, Deanna Artis, MD  memantine (NAMENDA) 5 MG tablet Take 5 mg by mouth 2 (two) times daily.    [provider]  pravastatin (PRAVACHOL) 40 MG tablet Take 40 mg by mouth daily.    [provider]  predniSONE (DELTASONE) 10 MG tablet 40 mg once daily for 4 more days 03/22/17   Governor RooksLord, Rebecca, MD    Allergies Patient has no known allergies.  No family history on file.  Social History Social History   Tobacco Use  . Smoking status: Never Smoker  . Smokeless tobacco: Never Used  Substance Use Topics  . Alcohol use: No  . Drug use: Not on file    Review of Systems EM caveat  Constitutional: No fever/chills ENT: No sore throat. Cardiovascular: Denies chest pain. Respiratory: See HPI Gastrointestinal: No abdominal pain.   Musculoskeletal: Negative for back pain.  Reports only discomfort she has is a  little bit of discomfort over her left arm IVs. Skin: Negative for rash. Neurological: Negative for headaches   ____________________________________________   PHYSICAL EXAM:  VITAL SIGNS: ED Triage Vitals  Enc Vitals Group     BP 03/12/18 1634 (!) 136/40     Pulse Rate 03/12/18 1634 82     Resp 03/12/18 1634 (!) 22     Temp 03/12/18 1634 98.4 F (36.9 C)     Temp Source 03/12/18 1634 Oral     SpO2 03/12/18 1634 90 %     Weight 03/12/18 1635 165 lb (74.8 kg)     Height 03/12/18 1635 5\' 3"  (1.6 m)     Head Circumference --      Peak Flow --      Pain Score 03/12/18 1634 0     Pain Loc --      Pain Edu? --      Excl. in GC? --     Constitutional: Alert and oriented to self and daughter but not to reason for being here or to place.  Appears moderately ill, mouth open with notable end expiratory wheezing and mild tachypnea slight accessory muscle use Eyes: Conjunctivae are normal. Head: Atraumatic. Nose: No congestion/rhinnorhea. Mouth/Throat: Mucous membranes are moist. Neck: No stridor.  Cardiovascular: Normal rate, regular rhythm. Grossly normal heart sounds.  Good peripheral circulation. Respiratory: Increased work of breathing.  Mild to moderate increased work of breathing.  Diffuse end expiratory wheezing in both lungs.  No focal crackles.  Some slight rhonchi centrally.  Speaks in short 1-2 word sentences.  Oxygen saturation dipping down to as low as 87% on room air as she is conversing, and does appear short of breath.  No tripoding or evidence of respiratory failure Gastrointestinal: Soft and nontender. No distention. Musculoskeletal: No lower extremity tenderness is about 1+ pitting edema in the lower extremities with compression stockings bilateral. Neurologic:  Normal speech and language. No gross focal neurologic deficits are appreciated.  Skin:  Skin is warm, dry and intact. No rash noted. Psychiatric: Mood and affect are slightly confused, known dementia.  Daughter  does report she seems a little more confused today than her normal.  ____________________________________________   LABS (all labs ordered are listed, but only abnormal results are displayed)  Labs Reviewed  BASIC METABOLIC PANEL - Abnormal; Notable for the following components:      Result Value   Glucose, Bld 154 (*)    Creatinine, Ser 1.13 (*)    GFR calc non Af Amer 44 (*)    GFR calc Af Amer 51 (*)    All other components within normal limits  CULTURE, BLOOD (ROUTINE X 2)  CULTURE, BLOOD (ROUTINE X 2)  CBC  TROPONIN I  INFLUENZA PANEL BY PCR (TYPE A & B)   ____________________________________________  EKG  Reviewed entered by me at 1630 Heart rate 85 QRS 100 QTc 450 Normal sinus rhythm, incomplete right bundle branch block.  No evidence acute ischemia ____________________________________________  RADIOLOGY  Dg Chest Portable 1 View  Result Date: 03/12/2018 CLINICAL DATA:  Dyspnea EXAM: PORTABLE CHEST 1 VIEW COMPARISON:  03/22/2017, CT 03/22/2017 FINDINGS: Probable small left pleural effusion. Hazy opacity at the left lung base. Mild cardiomegaly with aortic atherosclerosis. Right paratracheal opacity, corresponding to thyroid mass. No pneumothorax. IMPRESSION: 1. Mild cardiomegaly 2. Probable small left effusion with hazy atelectasis or infiltrate at the left base Electronically Signed   By: Jasmine PangKim  Fujinaga M.D.   On: 03/12/2018 17:12     Personally viewed the image, concerning for possible left lower lobe infiltrate ____________________________________________   PROCEDURES  Procedure(s) performed: None  Procedures  Critical Care performed: Yes, see critical care note(s)  CRITICAL CARE Performed by: Sharyn CreamerMark Euan Wandler   Total critical care time: 35 minutes  Critical care time was exclusive of separately billable procedures and treating other patients.  Critical care was necessary to treat or prevent imminent or life-threatening deterioration.  Critical care was  time spent personally by me on the following activities: development of treatment plan with patient and/or surrogate as well as nursing, discussions with consultants, evaluation of patient's response to treatment, examination of patient, obtaining history from patient or surrogate, ordering and performing treatments and interventions, ordering and review of laboratory studies, ordering and review of radiographic studies, pulse oximetry and re-evaluation of patient's condition.  Patient with apparent community-acquired pneumonia, ongoing increased work of breathing, requiring multiple nebulizer treatments and oxygen therapy.  Patient is at elevated risk for development of sepsis, in addition elevated level of concern for worsening respiratory decompensation ____________________________________________   INITIAL IMPRESSION / ASSESSMENT AND PLAN / ED COURSE  Pertinent labs & imaging results that were available during my care of the patient were reviewed by me and considered in my medical decision making (see chart for details).   For evaluation of cough with wheezing.  Does not have a clearly established history of asthma or COPD, but does he is albuterol intermittently.  She appears to have increased work of breathing, notable and expiratory wheezing.  No chest pain.  EKG and troponin reassuring.  Flu test negative  ----------------------------------------- 6:28 PM on 03/12/2018 -----------------------------------------  Patient to continue to have an expiratory wheezing, she is alert but confused which is evidently about baseline per daughter may be a little worse today.  O2 saturation dipping down to 89% on room air, I have ordered additional nebulizer therapy and have placed the patient on oxygen at this time with improvement to saturations in the mid 90s.  Discussed with patient and her daughter, will admit for further care, treatment of bronchospasm, and also concern for possible left lower lobe  community-acquired pneumonia.  She has not been recently hospitalized on antibiotics in several months according to her daughter and she currently lives in assisted living.      ____________________________________________   FINAL CLINICAL IMPRESSION(S) / ED DIAGNOSES  Final diagnoses:  Community acquired pneumonia of left lower lobe of lung (HCC)  Bronchospasm with bronchitis, acute        Note:  This document was prepared using Conservation officer, historic buildingsDragon voice recognition software and may include unintentional dictation errors       Sharyn CreamerQuale, Shakila Mak, MD 03/12/18 1830

## 2018-03-13 LAB — GLUCOSE, CAPILLARY
GLUCOSE-CAPILLARY: 242 mg/dL — AB (ref 70–99)
Glucose-Capillary: 201 mg/dL — ABNORMAL HIGH (ref 70–99)
Glucose-Capillary: 172 mg/dL — ABNORMAL HIGH (ref 70–99)
Glucose-Capillary: 227 mg/dL — ABNORMAL HIGH (ref 70–99)

## 2018-03-13 LAB — CBC
HCT: 38.5 % (ref 36.0–46.0)
Hemoglobin: 12 g/dL (ref 12.0–15.0)
MCH: 30 pg (ref 26.0–34.0)
MCHC: 31.2 g/dL (ref 30.0–36.0)
MCV: 96.3 fL (ref 80.0–100.0)
Platelets: 176 10*3/uL (ref 150–400)
RBC: 4 MIL/uL (ref 3.87–5.11)
RDW: 14.3 % (ref 11.5–15.5)
WBC: 7.1 10*3/uL (ref 4.0–10.5)
nRBC: 0 % (ref 0.0–0.2)

## 2018-03-13 LAB — BASIC METABOLIC PANEL
Anion gap: 10 (ref 5–15)
BUN: 21 mg/dL (ref 8–23)
CO2: 27 mmol/L (ref 22–32)
Calcium: 8.8 mg/dL — ABNORMAL LOW (ref 8.9–10.3)
Chloride: 103 mmol/L (ref 98–111)
Creatinine, Ser: 1.04 mg/dL — ABNORMAL HIGH (ref 0.44–1.00)
GFR calc Af Amer: 56 mL/min — ABNORMAL LOW (ref >60)
GFR calc non Af Amer: 48 mL/min — ABNORMAL LOW (ref >60)
Glucose, Bld: 230 mg/dL — ABNORMAL HIGH (ref 70–99)
POTASSIUM: 4.4 mmol/L (ref 3.5–5.1)
Sodium: 140 mmol/L (ref 135–145)

## 2018-03-13 LAB — MRSA PCR SCREENING: MRSA by PCR: NEGATIVE

## 2018-03-13 MED ORDER — SODIUM CHLORIDE 0.9 % IV SOLN
INTRAVENOUS | Status: DC | PRN
  Administered 2018-03-13 – 2018-03-14 (×2): 250 mL via INTRAVENOUS

## 2018-03-13 NOTE — Progress Notes (Signed)
[facility] - [place] [facility]   PATIENT NAME: Stephanie Duarte    MR#:  [id number]  DATE OF BIRTH:  06-15-1928  SUBJECTIVE:  No new complaint this morning.  Patient pleasantly confused.  Daughter present at bedside.  No fevers.  + Right   REVIEW OF SYSTEMS:  ROS  Unobtainable due to underlying dementia.  DRUG ALLERGIES:   Allergies  Allergen Reactions  . Meloxicam Swelling   VITALS:  Blood pressure (!) 101/46, pulse 84, temperature 98.1 F (36.7 C), temperature source Oral, resp. rate 20, height 4\' 10"  (1.473 m), weight 71.9 kg, SpO2 90 %. PHYSICAL EXAMINATION:    Physical Exam  Constitutional: She is oriented to person, place, and time and well-developed, well-nourished, and in no distress.  Pleasantly confused.  HENT:  Head: Normocephalic and atraumatic.  Eyes: Pupils are equal, round, and reactive to light. EOM are normal.  Neck: Normal range of motion. Neck supple.  Cardiovascular: Normal rate and regular rhythm.  Pulmonary/Chest: Effort normal and breath sounds normal.  Abdominal: Soft. Bowel sounds are normal.  Musculoskeletal: Normal range of motion.        General: Edema present.  Neurological: She is alert and oriented to person, place, and time.  Skin: Skin is warm.  Psychiatric: Affect normal.   LABORATORY PANEL:  Female CBC Recent Labs  Lab 03/13/18 0551  WBC 7.1  HGB 12.0  HCT 38.5  PLT 176   ------------------------------------------------------------------------------------------------------------------ Chemistries  Recent Labs  Lab 03/13/18 0551  NA 140  K 4.4  CL 103  CO2 27  GLUCOSE 230*  BUN 21  CREATININE 1.04*  CALCIUM 8.8*   RADIOLOGY:  Dg Chest Portable 1 View  Result Date: 03/12/2018 CLINICAL DATA:  Dyspnea EXAM: PORTABLE CHEST 1 VIEW COMPARISON:  03/22/2017, CT 03/22/2017 FINDINGS: Probable small left pleural effusion. Hazy opacity at the left lung base. Mild cardiomegaly with aortic atherosclerosis.  Right paratracheal opacity, corresponding to thyroid mass. No pneumothorax. IMPRESSION: 1. Mild cardiomegaly 2. Probable small left effusion with hazy atelectasis or infiltrate at the left base Electronically Signed   By: Jasmine Pang M.D.   On: 03/12/2018 17:12   ASSESSMENT AND PLAN:   Left lower lobe pneumonia with acute hypoxic respiratory failure Started patient on IV ceftriaxone and azithromycin.  Sputum cultures ordered.  Blood culture sent from emergency room.  Nebulizers scheduled.  Supplemental oxygen.  Influenza checked and negative  *Asthma exacerbation secondary to pneumonia.  See above. Continue home inhalers  *Dementia.  Monitor for inpatient delirium. Stable this morning.  Pleasantly confused.  At baseline per daughter present at bedside.  *Hypertension.  We will continue home medications.  *CKD stage III is stable  DVT prophylaxis; Lovenox  CODE STATUS: DNR/DNI   All the records are reviewed and case discussed with Care Management/Social Worker. Management plans discussed with the patient, family and they are in agreement.  CODE STATUS: DNR  TOTAL TIME TAKING CARE OF THIS PATIENT: 35 minutes.   More than 50% of the time was spent in counseling/coordination of care: YES  POSSIBLE D/C IN 2 DAYS, DEPENDING ON CLINICAL CONDITION.   Akin Yi M.D on 03/13/2018 at 4:03 PM  Between 7am to 6pm - Pager - [phone]  After 6pm go to [email or web address] - Social research officer, government  [facility]  Office  [phone]  CC: Primary care physician; Raynelle Bring  Note: This dictation was prepared with Dragon dictation along with smaller phrase technology. Any transcriptional errors that result from this  process are unintentional.

## 2018-03-13 NOTE — Clinical Social Work Note (Signed)
Clinical Social Work Assessment  Patient Details  Name: Stephanie Duarte MRN: [id number] Date of Birth: Jan 15, 1929  Date of referral:  03/13/18               Reason for consult:  Discharge Planning                Permission sought to share information with:    Permission granted to share information::     Name::        Agency::     Relationship::     Contact Information:     Housing/Transportation Living arrangements for the past 2 months:  Assisted Living Facility Source of Information:  Adult Children Patient Interpreter Needed:  None Criminal Activity/Legal Involvement Pertinent to Current Situation/Hospitalization:  No - Comment as needed Significant Relationships:  Adult Children Lives with:  Facility Resident Do you feel safe going back to the place where you live?  Yes Need for family participation in patient care:  Yes (Comment)  Care giving concerns:  Patient is a long term resident of [facility].   Social Worker assessment / plan:  Staff has reported patient being disoriented and at time of CSW visit patient was sleeping. CSW had spoken to Knowlton from [place] this morning as Tiffany had called for an update.Tiffany [phone]) stated that patient can return at time of discharge. Patient is wheelchair bound at baseline.   CSW contacted patient's daughter: Lawerance Cruel: [phone] and Mrs. Ernestine Mcmurray confirmed that she does want her mother to return to [place] and that typically [facility] transports. She stated her mother has resided at [facility] for almost two years.   Employment status:    Insurance information:    PT Recommendations:    Information / Referral to community resources:     Patient/Family's Response to care:  Patient's daughter expressed appreciation for CSW assistance.  Patient/Family's Understanding of and Emotional Response to Diagnosis, Current Treatment, and Prognosis:  Patient's daughter was very calm and answered in short answers  so it was difficult to gage how she was dealing with her mother's admission to the hospital at this time.   Emotional Assessment Appearance:    Attitude/Demeanor/Rapport:    Affect (typically observed):  Calm Orientation:  Oriented to Self Alcohol / Substance use:  Not Applicable Psych involvement (Current and /or in the community):  No (Comment)  Discharge Needs  Concerns to be addressed:  Care Coordination Readmission within the last 30 days:  No Current discharge risk:  None Barriers to Discharge:  No Barriers Identified   York Spaniel, LCSW 03/13/2018, 2:36 PM

## 2018-03-13 NOTE — NC FL2 (Addendum)
[place] MEDICAID [id number] LEVEL OF CARE SCREENING TOOL     IDENTIFICATION  Patient Name: Stephanie Duarte Birthdate: 11-13-1928 Sex: female Admission Date (Current Location): 03/12/2018  [place] and [place] Number:  Chiropodist and Address:  [facility], [address], [place], [place] [address]      Provider Number: [id number]  Attending Physician Name and Address:  Jama Flavors, MD  Relative Name and Phone Number:       Current Level of Care: Hospital Recommended Level of Care: Assisted Living Facility, Memory Care Prior Approval Number:    Date Approved/Denied:   PASRR Number:    Discharge Plan: (ALF memory care)    Current Diagnoses: Dementia Patient Active Problem List   Diagnosis Date Noted  . Pneumonia 03/12/2018  . Fever 07/04/2015    Orientation RESPIRATION BLADDER Height & Weight     Self  Normal Continent Weight: 158 lb 8 oz (71.9 kg) Height:  4\' 10"  (147.3 cm)  BEHAVIORAL SYMPTOMS/MOOD NEUROLOGICAL BOWEL NUTRITION STATUS  (none)   Continent Diet(regular)  AMBULATORY STATUS COMMUNICATION OF NEEDS Skin   Total Care Verbally Normal                       Personal Care Assistance Level of Assistance  Bathing, Feeding, Dressing Bathing Assistance: Maximum assistance Feeding assistance: Maximum assistance Dressing Assistance: Maximum assistance     Functional Limitations Info  Sight Sight Info: Impaired        SPECIAL CARE FACTORS FREQUENCY                       Contractures Contractures Info: Not present    Additional Factors Info  Code Status Code Status Info: dnr             DISCHARGE MEDICATIONS:        Allergies as of 03/17/2018      Reactions   Meloxicam Swelling         Medication List    STOP taking these medications   albuterol 0.63 MG/3ML nebulizer solution Commonly known as:  ACCUNEB Replaced by:  albuterol (2.5 MG/3ML) 0.083% nebulizer solution   bisacodyl 5 MG EC  tablet Commonly known as:  DULCOLAX   polyethylene glycol powder powder Commonly known as:  GLYCOLAX/MIRALAX     TAKE these medications   acetaminophen 325 MG tablet Commonly known as:  TYLENOL Take 2 tablets (650 mg total) by mouth every 6 (six) hours as needed for mild pain.   albuterol (2.5 MG/3ML) 0.083% nebulizer solution Commonly known as:  PROVENTIL Dx J44.1 Replaces:  albuterol 0.63 MG/3ML nebulizer solution   allopurinol 100 MG tablet Commonly known as:  ZYLOPRIM Take 100 mg by mouth daily.   aspirin 81 MG chewable tablet Chew 81 mg by mouth daily.   budesonide 0.5 MG/2ML nebulizer solution Commonly known as:  PULMICORT Dx J44.1   cholecalciferol 1000 units tablet Commonly known as:  VITAMIN D Take 1,000 Units by mouth daily.   citalopram 20 MG tablet Commonly known as:  CELEXA Take 20 mg by mouth daily.   donepezil 5 MG tablet Commonly known as:  ARICEPT Take 5 mg by mouth at bedtime.   eucerin cream Apply topically 4 (four) times daily as needed.   furosemide 20 MG tablet Commonly known as:  LASIX Take 20 mg by mouth daily.   guaiFENesin-dextromethorphan 100-10 MG/5ML syrup Commonly known as:  ROBITUSSIN DM Take 5 mLs by mouth  every 6 (six) hours as needed.   levothyroxine 50 MCG tablet Commonly known as:  SYNTHROID, LEVOTHROID Take 50 mcg by mouth every morning.   liver oil-zinc oxide 40 % ointment Commonly known as:  DESITIN Apply topically continuous as needed for irritation.   memantine 10 MG tablet Commonly known as:  NAMENDA Take 10 mg by mouth 2 (two) times daily.   metFORMIN 500 MG tablet Commonly known as:  GLUCOPHAGE Take 500 mg by mouth daily.   potassium chloride 10 MEQ tablet Commonly known as:  K-DUR Take 10 mEq by mouth daily.   pravastatin 40 MG tablet Commonly known as:  PRAVACHOL Take 40 mg by mouth daily.   PRESERVISION AREDS Caps Take 1 capsule by mouth 2 (two) times daily.         York Spaniel, [place]

## 2018-03-14 LAB — BASIC METABOLIC PANEL
Anion gap: 7 (ref 5–15)
BUN: 31 mg/dL — ABNORMAL HIGH (ref 8–23)
CO2: 29 mmol/L (ref 22–32)
CREATININE: 1.06 mg/dL — AB (ref 0.44–1.00)
Calcium: 8.8 mg/dL — ABNORMAL LOW (ref 8.9–10.3)
Chloride: 105 mmol/L (ref 98–111)
GFR, EST AFRICAN AMERICAN: 55 mL/min — AB (ref >60)
GFR, EST NON AFRICAN AMERICAN: 47 mL/min — AB (ref >60)
Glucose, Bld: 197 mg/dL — ABNORMAL HIGH (ref 70–99)
Potassium: 4.4 mmol/L (ref 3.5–5.1)
Sodium: 141 mmol/L (ref 135–145)

## 2018-03-14 LAB — CBC
HEMATOCRIT: 36.1 % (ref 36.0–46.0)
Hemoglobin: 11.3 g/dL — ABNORMAL LOW (ref 12.0–15.0)
MCH: 30.4 pg (ref 26.0–34.0)
MCHC: 31.3 g/dL (ref 30.0–36.0)
MCV: 97 fL (ref 80.0–100.0)
Platelets: 182 10*3/uL (ref 150–400)
RBC: 3.72 MIL/uL — AB (ref 3.87–5.11)
RDW: 14.7 % (ref 11.5–15.5)
WBC: 16.7 10*3/uL — ABNORMAL HIGH (ref 4.0–10.5)
nRBC: 0 % (ref 0.0–0.2)

## 2018-03-14 LAB — GLUCOSE, CAPILLARY
Glucose-Capillary: 174 mg/dL — ABNORMAL HIGH (ref 70–99)
Glucose-Capillary: 190 mg/dL — ABNORMAL HIGH (ref 70–99)
Glucose-Capillary: 257 mg/dL — ABNORMAL HIGH (ref 70–99)
Glucose-Capillary: 156 mg/dL — ABNORMAL HIGH (ref 70–99)

## 2018-03-14 LAB — MAGNESIUM: Magnesium: 2.1 mg/dL (ref 1.7–2.4)

## 2018-03-14 MED ORDER — METHYLPREDNISOLONE SODIUM SUCC 125 MG IJ SOLR
60.0000 mg | Freq: Every day | INTRAMUSCULAR | Status: DC
  Administered 2018-03-15 – 2018-03-17 (×3): 60 mg via INTRAVENOUS
  Filled 2018-03-14 (×3): qty 2

## 2018-03-14 MED ORDER — ALBUTEROL SULFATE (2.5 MG/3ML) 0.083% IN NEBU
2.5000 mg | INHALATION_SOLUTION | Freq: Four times a day (QID) | RESPIRATORY_TRACT | Status: DC
  Administered 2018-03-14 – 2018-03-17 (×11): 2.5 mg via RESPIRATORY_TRACT
  Filled 2018-03-14 (×11): qty 3

## 2018-03-14 MED ORDER — BUDESONIDE 0.5 MG/2ML IN SUSP
0.5000 mg | Freq: Two times a day (BID) | RESPIRATORY_TRACT | Status: DC
  Administered 2018-03-14 – 2018-03-16 (×5): 0.5 mg via RESPIRATORY_TRACT
  Filled 2018-03-14 (×6): qty 2

## 2018-03-14 NOTE — Progress Notes (Signed)
Patient ID: Stephanie Duarte, female   DOB: 09/14/1928, 85 y.o.   MRN: [id number]  [employer] PROGRESS NOTE  Stephanie Duarte [id number] DOB: 03/11/1929 DOA: 03/12/2018 PCP: [facility]  HPI/Subjective: Patient has dementia.  She states that she does not feel well.  Unable to describe things.  States that she does not have shortness of breath and cough when her daughter states that she does.  Objective: Vitals:   03/14/18 0411 03/14/18 1327  BP: 115/62 (!) 110/56  Pulse: 62 78  Resp: 20   Temp: 98.3 F (36.8 C) 98.4 F (36.9 C)  SpO2: 91% 99%    Intake/Output Summary (Last 24 hours) at 03/14/2018 1453 Last data filed at 03/14/2018 1350 Gross per 24 hour  Intake 1145.15 ml  Output 700 ml  Net 445.15 ml   Filed Weights   03/12/18 2102 03/13/18 0500 03/14/18 0500  Weight: 72.3 kg 71.9 kg 72 kg    ROS: Review of Systems  Unable to perform ROS: Dementia  Respiratory: Negative for cough and shortness of breath.    Exam: Physical Exam  HENT:  Nose: No mucosal edema.  Mouth/Throat: No oropharyngeal exudate or posterior oropharyngeal edema.  Eyes: Pupils are equal, round, and reactive to light. Conjunctivae, EOM and lids are normal.  Neck: No JVD present. Carotid bruit is not present. No edema present. No thyroid mass and no thyromegaly present.  Cardiovascular: S1 normal and S2 normal. Exam reveals no gallop.  No murmur heard. Pulses:      Dorsalis pedis pulses are 2+ on the right side and 2+ on the left side.  Respiratory: No respiratory distress. She has decreased breath sounds in the right middle field, the right lower field, the left middle field and the left lower field. She has wheezes in the right middle field, the right lower field and the left lower field. She has no rhonchi. She has no rales.  GI: Soft. Bowel sounds are normal. There is no abdominal tenderness.  Musculoskeletal:     Right ankle: She exhibits no swelling.     Left ankle: She exhibits no  swelling.  Lymphadenopathy:    She has no cervical adenopathy.  Neurological: She is alert.  Skin: Skin is warm. No rash noted. Nails show no clubbing.  Psychiatric: She has a normal mood and affect.      Data Reviewed: Basic Metabolic Panel: Recent Labs  Lab 03/12/18 1648 03/13/18 0551 03/14/18 0436  NA 140 140 141  K 4.4 4.4 4.4  CL 102 103 105  CO2 29 27 29   GLUCOSE 154* 230* 197*  BUN 21 21 31*  CREATININE 1.13* 1.04* 1.06*  CALCIUM 9.3 8.8* 8.8*  MG  --   --  2.1   CBC: Recent Labs  Lab 03/12/18 1648 03/13/18 0551 03/14/18 0436  WBC 8.9 7.1 16.7*  HGB 13.6 12.0 11.3*  HCT 43.9 38.5 36.1  MCV 97.8 96.3 97.0  PLT 205 176 182   Cardiac Enzymes: Recent Labs  Lab 03/12/18 1648  TROPONINI <0.03    CBG: Recent Labs  Lab 03/13/18 1150 03/13/18 1631 03/13/18 2134 03/14/18 0757 03/14/18 1129  GLUCAP 242* 172* 227* 174* 190*    Recent Results (from the past 240 hour(s))  Blood Culture (routine x 2)     Status: None (Preliminary result)   Collection Time: 03/12/18  6:25 PM  Result Value Ref Range Status   Specimen Description BLOOD LEFT ANTECUBITAL  Final   Special Requests   Final  BOTTLES DRAWN AEROBIC AND ANAEROBIC Blood Culture adequate volume   Culture   Final    NO GROWTH 2 DAYS Performed at [facility], [address] [place], [place] [address]    Report Status PENDING  Incomplete  Blood Culture (routine x 2)     Status: None (Preliminary result)   Collection Time: 03/12/18  6:30 PM  Result Value Ref Range Status   Specimen Description BLOOD BLOOD LEFT WRIST  Final   Special Requests   Final    BOTTLES DRAWN AEROBIC AND ANAEROBIC Blood Culture adequate volume   Culture   Final    NO GROWTH 2 DAYS Performed at [facility], [address], [place], [place] [address]    Report Status PENDING  Incomplete  MRSA PCR Screening     Status: None   Collection Time: 03/13/18 12:15 AM  Result Value Ref Range Status   MRSA  by PCR NEGATIVE NEGATIVE Final    Comment:        The GeneXpert MRSA Assay (FDA approved for NASAL specimens only), is one component of a comprehensive MRSA colonization surveillance program. It is not intended to diagnose MRSA infection nor to guide or monitor treatment for MRSA infections. Performed at [facility], [address] [place], [place] [address]      Studies: Dg Chest Portable 1 View  Result Date: 03/12/2018 CLINICAL DATA:  Dyspnea EXAM: PORTABLE CHEST 1 VIEW COMPARISON:  03/22/2017, CT 03/22/2017 FINDINGS: Probable small left pleural effusion. Hazy opacity at the left lung base. Mild cardiomegaly with aortic atherosclerosis. Right paratracheal opacity, corresponding to thyroid mass. No pneumothorax. IMPRESSION: 1. Mild cardiomegaly 2. Probable small left effusion with hazy atelectasis or infiltrate at the left base Electronically Signed   By: Jasmine Pang M.D.   On: 03/12/2018 17:12    Scheduled Meds: . albuterol  2.5 mg Nebulization Q6H  . allopurinol  100 mg Oral Daily  . aspirin  81 mg Oral Daily  . budesonide (PULMICORT) nebulizer solution  0.5 mg Nebulization BID  . citalopram  20 mg Oral Daily  . donepezil  5 mg Oral QHS  . enoxaparin (LOVENOX) injection  40 mg Subcutaneous Q24H  . furosemide  20 mg Oral Daily  . insulin aspart  0-9 Units Subcutaneous TID WC  . levothyroxine  50 mcg Oral Q0600  . memantine  10 mg Oral BID  . [START ON 03/15/2018] methylPREDNISolone (SOLU-MEDROL) injection  60 mg Intravenous Daily  . polyethylene glycol powder  17 g Oral Daily  . potassium chloride  10 mEq Oral Daily  . pravastatin  40 mg Oral Daily   Continuous Infusions: . sodium chloride Stopped (03/14/18 1056)  . azithromycin (ZITHROMAX) 500 MG IVPB (Vial-Mate [employer]) 250 mL/hr at 03/14/18 1108  . cefTRIAXone (ROCEPHIN)  IV Stopped (03/14/18 1051)    Assessment/Plan:  1. Acute hypoxic respiratory failure.  Pulse ox dropped down into the 80s with limited  movement O2 supplementation 2. Left lower lobe pneumonia on Rocephin and Zithromax 3. Asthma exacerbation on Solu-Medrol and nebulizer treatments with DuoNeb and budesonide. 4. Dementia.  On Namenda 5. Chronic kidney disease stage III 6. Hyperlipidemia unspecified on pravastatin 7. Hypothyroidism on levothyroxine 8. History of gout on allopurinol  Code Status:     Code Status Orders  (From admission, onward)         Start     Ordered   03/12/18 1929  Do not attempt resuscitation (DNR)  Continuous    Question Answer  Comment  In the event of cardiac or respiratory ARREST Do not call a "code blue"   In the event of cardiac or respiratory ARREST Do not perform Intubation, CPR, defibrillation or ACLS   In the event of cardiac or respiratory ARREST Use medication by any route, position, wound care, and other measures to relive pain and suffering. May use oxygen, suction and manual treatment of airway obstruction as needed for comfort.      03/12/18 1930        Code Status History    Date Active Date Inactive Code Status Order ID Comments User Context   07/04/2015 1403 07/07/2015 1850 DNR [id number]  Katha Hamming, MD ED    Advance Directive Documentation     Most Recent Value  Type of Advance Directive  Healthcare Power of Attorney, Living will  Pre-existing out of facility DNR order (yellow form or pink MOST form)  -  "MOST" Form in Place?  -     Family Communication: Spoke with daughter at the bedside Disposition Plan: To be determined  Antibiotics:  Rocephin  Zithromax  Time spent: 28 minutes  Demri Poulton [employer]

## 2018-03-15 LAB — GLUCOSE, CAPILLARY
GLUCOSE-CAPILLARY: 203 mg/dL — AB (ref 70–99)
Glucose-Capillary: 108 mg/dL — ABNORMAL HIGH (ref 70–99)
Glucose-Capillary: 242 mg/dL — ABNORMAL HIGH (ref 70–99)
Glucose-Capillary: 239 mg/dL — ABNORMAL HIGH (ref 70–99)

## 2018-03-15 MED ORDER — ZINC OXIDE 40 % EX OINT
TOPICAL_OINTMENT | CUTANEOUS | Status: DC | PRN
  Filled 2018-03-15 (×3): qty 113

## 2018-03-15 NOTE — Evaluation (Signed)
Physical Therapy Evaluation Patient Details Name: Stephanie Duarte MRN: [id number] DOB: 02-03-1929 Today's Date: 03/15/2018   History of Present Illness  Patient is an 85 y/o female that presents with shortness of breath. SHe has a history of advanced dementia and resides at [facility].   Clinical Impression  Patient is admitted for shortness of breath, is noted to have SpO2 sats around 88-91% throughout this session. She is a resident at a memory care unit currently and is wheelchair bound at baseline per daughter. At baseline she is able to transfer with assistance of RN staff, and is able to perform transfer at roughly her baseline this session. She requires assistance for all transfers this session, though this also appears to be at or near her mobility baseline. She appears near her baseline with all mobility and would likely benefit from returning to her previous living establishment when she is medically appropriate for discharge.     Follow Up Recommendations Other (comment)(Return to prior living establishment, per daughter patient did not progress with therapy at last SNF placement)    Equipment Recommendations       Recommendations for Other Services       Precautions / Restrictions Precautions Precautions: Fall Restrictions Weight Bearing Restrictions: No      Mobility  Bed Mobility Overal bed mobility: Needs Assistance Bed Mobility: Supine to Sit     Supine to sit: Mod assist;Max assist     General bed mobility comments: Patient requires assistance for torso and LE management to complete transfer.   Transfers Overall transfer level: Needs assistance Equipment used: 1 person hand held assist Transfers: Sit to/from Stand Sit to Stand: Mod assist         General transfer comment: Patient requires verbal and tactile cuing for assistance.   Ambulation/Gait Ambulation/Gait assistance: Mod assist Gait Distance (Feet): 3 Feet Assistive device: 1 person hand  held assist Gait Pattern/deviations: Decreased step length - left;Decreased step length - right   Gait velocity interpretation: <1.31 ft/sec, indicative of household ambulator General Gait Details: Patient is able to take small shuffling steps to transfer to recliner.   Stairs            Wheelchair Mobility    Modified Rankin (Stroke Patients Only)       Balance Overall balance assessment: Needs assistance Sitting-balance support: Bilateral upper extremity supported Sitting balance-Leahy Scale: Fair     Standing balance support: Bilateral upper extremity supported Standing balance-Leahy Scale: Poor                               Pertinent Vitals/Pain Pain Assessment: (Patient reports buttock pain where skin cream was applied by CNA)    Home Living Family/patient expects to be discharged to:: Skilled nursing facility                 Additional Comments: Pt lives at [facility], provided with assistance for ADLs.     Prior Function Level of Independence: Needs assistance   Gait / Transfers Assistance Needed: Patient is able to transfer to wheelchair only.            Hand Dominance        Extremity/Trunk Assessment   Upper Extremity Assessment Upper Extremity Assessment: Generalized weakness    Lower Extremity Assessment Lower Extremity Assessment: Generalized weakness       Communication   Communication: No difficulties  Cognition Arousal/Alertness: Awake/alert Behavior During Therapy:  WFL for tasks assessed/performed Overall Cognitive Status: History of cognitive impairments - at baseline                                 General Comments: Patient is not oriented to place or situation. She is fearful she has "screwed it up".       General Comments      Exercises General Exercises - Lower Extremity Heel Slides: AROM;10 reps;Both Straight Leg Raises: AROM;10 reps;Both Other Exercises Other Exercises: CNA  assisted in changing diaper and rolling patient both L and R with min-mod A for verbal cuing/tactile cuing.    Assessment/Plan    PT Assessment Patient needs continued PT services  PT Problem List Decreased strength;Decreased balance;Decreased cognition;Decreased mobility;Decreased knowledge of use of DME;Decreased safety awareness;Decreased activity tolerance;Cardiopulmonary status limiting activity       PT Treatment Interventions DME instruction;Functional mobility training;Gait training;Therapeutic activities;Neuromuscular re-education;Therapeutic exercise;Stair training;Balance training    PT Goals (Current goals can be found in the Care Plan section)  Acute Rehab PT Goals Patient Stated Goal: To return home  PT Goal Formulation: With family Time For Goal Achievement: 03/29/18 Potential to Achieve Goals: Good    Frequency Min 2X/week   Barriers to discharge        Co-evaluation               AM-PAC PT "6 Clicks" Mobility  Outcome Measure Help needed turning from your back to your side while in a flat bed without using bedrails?: A Lot Help needed moving from lying on your back to sitting on the side of a flat bed without using bedrails?: A Lot Help needed moving to and from a bed to a chair (including a wheelchair)?: A Lot Help needed standing up from a chair using your arms (e.g., wheelchair or bedside chair)?: A Lot Help needed to walk in hospital room?: Total Help needed climbing 3-5 steps with a railing? : Total 6 Click Score: 10    End of Session Equipment Utilized During Treatment: Gait belt Activity Tolerance: Patient tolerated treatment well Patient left: in chair;with call bell/phone within reach;with chair alarm set;with family/visitor present Nurse Communication: Mobility status(O2 sats) PT Visit Diagnosis: Muscle weakness (generalized) (M62.81);Unsteadiness on feet (R26.81)    Time: 1420-1440 PT Time Calculation (min) (ACUTE ONLY): 20  min   Charges:   PT Evaluation $PT Eval Moderate Complexity: 1 Mod PT Treatments $Therapeutic Activity: 8-22 mins(Assistance with rolling/turning for diaper change)       Alva Garnet PT, DPT, CSCS    03/15/2018, 4:17 PM

## 2018-03-15 NOTE — Progress Notes (Signed)
   03/15/18 1600  Clinical Encounter Type  Visited With Patient;Family  Visit Type Initial;Spiritual support;Other (Comment) (Rapid Response for her daughter)  Referral From Nurse  Recommendations Follow-up with daughter.  Spiritual Encounters  Spiritual Needs Emotional;Prayer  Stress Factors  Patient Stress Factors  (Confusion and uncertainty)   Chaplain responded to a Rapid Response page for the patient's daughter Romero Belling. Chaplain attended to the patient who was confused and startled by the activity in her room. The patient did not have a clear awareness that her daughter had had a medical incident. Chaplain remained with the patient, praying for her health and peace until all staff had exited the room. Then, Chaplain calmed the patient and left for the Emergency Department.

## 2018-03-15 NOTE — Progress Notes (Signed)
Patient ID: Stephanie Duarte, female   DOB: 06/25/1928, 85 y.o.   MRN: [id number]  Patient ID: Stephanie Duarte, female   DOB: 11/28/1928, 85 y.o.   MRN: [id number]  [employer] PROGRESS NOTE  Stephanie Duarte NWG:[id number] DOB: 01/13/1929 DOA: 03/12/2018 [facility]: [facility]  HPI/Subjective: Patient has dementia.  She states that she does not feel well.  Unable to describe things.  States that she does not have shortness of breath and cough when her daughter states that she does.  Objective: Vitals:   03/14/18 2201 03/15/18 0605  BP:  128/77  Pulse:  (!) 57  Resp:  18  Temp:  97.7 F (36.5 C)  SpO2: 97% 93%    Intake/Output Summary (Last 24 hours) at 03/15/2018 1322 Last data filed at 03/14/2018 1841 Gross per 24 hour  Intake 720 ml  Output 300 ml  Net 420 ml   Filed Weights   03/13/18 0500 03/14/18 0500 03/15/18 0500  Weight: 71.9 kg 72 kg 71.1 kg    ROS: Review of Systems  Unable to perform ROS: Dementia  Respiratory: Negative for cough and shortness of breath.    Exam: Physical Exam  HENT:  Nose: No mucosal edema.  Mouth/Throat: No oropharyngeal exudate or posterior oropharyngeal edema.  Eyes: Pupils are equal, round, and reactive to light. Conjunctivae, EOM and lids are normal.  Neck: No JVD present. Carotid bruit is not present. No edema present. No thyroid mass and no thyromegaly present.  Cardiovascular: S1 normal and S2 normal. Exam reveals no gallop.  No murmur heard. Pulses:      Dorsalis pedis pulses are 2+ on the right side and 2+ on the left side.  Respiratory: No respiratory distress. She has decreased breath sounds in the right middle field, the right lower field, the left middle field and the left lower field. She has wheezes in the right middle field, the right lower field and the left lower field. She has no rhonchi. She has no rales.  GI: Soft. Bowel sounds are normal. There is no abdominal tenderness.  Musculoskeletal:     Right ankle: She exhibits  no swelling.     Left ankle: She exhibits no swelling.  Lymphadenopathy:    She has no cervical adenopathy.  Neurological: She is alert.  Skin: Skin is warm. No rash noted. Nails show no clubbing.  Psychiatric: She has a normal mood and affect.      Data Reviewed: Basic Metabolic Panel: Recent Labs  Lab 03/12/18 1648 03/13/18 0551 03/14/18 0436  NA 140 140 141  K 4.4 4.4 4.4  CL 102 103 105  CO2 29 27 29   GLUCOSE 154* 230* 197*  BUN 21 21 31*  CREATININE 1.13* 1.04* 1.06*  CALCIUM 9.3 8.8* 8.8*  MG  --   --  2.1   CBC: Recent Labs  Lab 03/12/18 1648 03/13/18 0551 03/14/18 0436  WBC 8.9 7.1 16.7*  HGB 13.6 12.0 11.3*  HCT 43.9 38.5 36.1  MCV 97.8 96.3 97.0  PLT 205 176 182   Cardiac Enzymes: Recent Labs  Lab 03/12/18 1648  TROPONINI <0.03    CBG: Recent Labs  Lab 03/14/18 1129 03/14/18 1637 03/14/18 2150 03/15/18 0735 03/15/18 1115  GLUCAP 190* 257* 156* 108* 203*    Recent Results (from the past 240 hour(s))  Blood Culture (routine x 2)     Status: None (Preliminary result)   Collection Time: 03/12/18  6:25 PM  Result Value Ref Range Status   Specimen  Description BLOOD LEFT ANTECUBITAL  Final   Special Requests   Final    BOTTLES DRAWN AEROBIC AND ANAEROBIC Blood Culture adequate volume   Culture   Final    NO GROWTH 3 DAYS Performed at [facility], [address] [place], [place] [address]    Report Status PENDING  Incomplete  Blood Culture (routine x 2)     Status: None (Preliminary result)   Collection Time: 03/12/18  6:30 PM  Result Value Ref Range Status   Specimen Description BLOOD BLOOD LEFT WRIST  Final   Special Requests   Final    BOTTLES DRAWN AEROBIC AND ANAEROBIC Blood Culture adequate volume   Culture   Final    NO GROWTH 3 DAYS Performed at [facility], [address], [place], [place] [address]    Report Status PENDING  Incomplete  MRSA PCR Screening     Status: None   Collection Time: 03/13/18  12:15 AM  Result Value Ref Range Status   MRSA by PCR NEGATIVE NEGATIVE Final    Comment:        The GeneXpert MRSA Assay (FDA approved for NASAL specimens only), is one component of a comprehensive MRSA colonization surveillance program. It is not intended to diagnose MRSA infection nor to guide or monitor treatment for MRSA infections. Performed at [facility], [address] [place], [place] [address]       Scheduled Meds: . albuterol  2.5 mg Nebulization Q6H  . allopurinol  100 mg Oral Daily  . aspirin  81 mg Oral Daily  . budesonide (PULMICORT) nebulizer solution  0.5 mg Nebulization BID  . citalopram  20 mg Oral Daily  . donepezil  5 mg Oral QHS  . enoxaparin (LOVENOX) injection  40 mg Subcutaneous Q24H  . furosemide  20 mg Oral Daily  . insulin aspart  0-9 Units Subcutaneous TID WC  . levothyroxine  50 mcg Oral Q0600  . memantine  10 mg Oral BID  . methylPREDNISolone (SOLU-MEDROL) injection  60 mg Intravenous Daily  . polyethylene glycol powder  17 g Oral Daily  . potassium chloride  10 mEq Oral Daily  . pravastatin  40 mg Oral Daily   Continuous Infusions: . sodium chloride Stopped (03/14/18 1056)  . azithromycin (ZITHROMAX) 500 MG IVPB (Vial-Mate [employer]) 500 mg (03/15/18 0834)  . cefTRIAXone (ROCEPHIN)  IV 1 g (03/15/18 0840)  . liver oil-zinc oxide      Assessment/Plan:  1. Acute hypoxic respiratory failure.  Yesterday patient's pulse ox dropped down with minimal movement into the 80s.  Today she is breathing on room air. 2. Left lower lobe pneumonia on Rocephin and Zithromax.  Repeat chest x-ray tomorrow. 3. Asthma exacerbation.  Continue daily Solu-Medrol and nebulizer treatments with DuoNeb and budesonide. 4. Dementia.  On Namenda 5. Chronic kidney disease stage III 6. Hyperlipidemia unspecified on pravastatin 7. Hypothyroidism on levothyroxine 8. History of gout on allopurinol 9. PT evaluation 10. Redness on the buttock.  Desitin  ordered  Code Status:     Code Status Orders  (From admission, onward)         Start     Ordered   03/12/18 1929  Do not attempt resuscitation (DNR)  Continuous    Question Answer Comment  In the event of cardiac or respiratory ARREST Do not call a "code blue"   In the event of cardiac or respiratory ARREST Do not perform Intubation, CPR, defibrillation or ACLS   In the event  of cardiac or respiratory ARREST Use medication by any route, position, wound care, and other measures to relive pain and suffering. May use oxygen, suction and manual treatment of airway obstruction as needed for comfort.      03/12/18 1930        Code Status History    Date Active Date Inactive Code Status Order ID Comments User Context   07/04/2015 1403 07/07/2015 1850 DNR [id number]  Katha Hamming, MD ED    Advance Directive Documentation     Most Recent Value  Type of Advance Directive  Healthcare Power of Attorney, Living will  Pre-existing out of facility DNR order (yellow form or pink MOST form)  -  "MOST" Form in Place?  -     Family Communication: Spoke with daughter at the bedside Disposition Plan: To be determined  Antibiotics:  Rocephin  Zithromax  Time spent:  Anaily Ashbaugh [employer]

## 2018-03-16 ENCOUNTER — Inpatient Hospital Stay: Payer: Medicare Other

## 2018-03-16 LAB — GLUCOSE, CAPILLARY
GLUCOSE-CAPILLARY: 109 mg/dL — AB (ref 70–99)
GLUCOSE-CAPILLARY: 347 mg/dL — AB (ref 70–99)
Glucose-Capillary: 127 mg/dL — ABNORMAL HIGH (ref 70–99)
Glucose-Capillary: 175 mg/dL — ABNORMAL HIGH (ref 70–99)
Glucose-Capillary: 191 mg/dL — ABNORMAL HIGH (ref 70–99)
Glucose-Capillary: 233 mg/dL — ABNORMAL HIGH (ref 70–99)

## 2018-03-16 NOTE — Progress Notes (Signed)
Nutrition Brief Note  Patient identified on the [facility] report.   Wt Readings from Last 15 Encounters:  03/16/18 72 kg  10/11/17 72.6 kg  03/22/17 72.6 kg  02/03/16 73.8 kg  07/07/15 74.5 kg   Stephanie Duarte  is a 85 y.o. female with a known history of asthma, hypertension, dementia presents from assisted living facility due to cough, shortness of breath and weakness.  Patient does have baseline dementia.  Does not ambulate.  Is bedbound and wheelchair-bound.  Here in the emergency room she has been found to have saturations in the 80s on room air.  Given multiple nebulizers and IV steroids for wheezing.  Chest x-ray shows left lower lobe pneumonia.  Patient is being admitted to the hospital.  Pt admitted with LLL pneumonia.   Pt unable to provide accurate history due to cognitive deficit. History obtained from pt's daughter at bedside, who is very involved in pt care. She reports that pt always has a good appetite. She estimates that pt consumed approximately 75% of her breakfast this AM; pt requires feeding assistance, however, always eats well. Daughter estimates that pt usually consumes about 75% of meals consistently [facility]. Daughter denies any difficulty chewing or swallowing foods, however, pt will sometimes complain of foods being difficult to get down secondary to long standing goiter.   Per daughter, pt has not lost any weight. Noted pt wt has been stable over the past 1.5 years. Daughter shares that pt has gained some weight since transferring to ALF. Pt used to walk several miles per day but is now wheelchair bound.   Nutrition-Focused physical exam completed. Findings are no fat depletion, no muscle depletion, and no edema.   Discussed with pt and daughter importance of good meal intake to promote healing. Both deny any further nutritional related concerns, but expressed appreciation for visit.    Last Hgb A1c: 6.7 (03/12/18). PTA DM medications are 500 mg  metformin daily .   Labs reviewed: CBGS: 109-239 (inpatient orders for glycemic control are 0-9 units insulin aspart TID with meals). Medications reviewed and include solu-medrol, which is likely contributing to elevated CBGS.   Body mass index is 33.17 kg/m. Patient meets criteria for obesity, class I based on current BMI.   Current diet order is regular, patient is consuming approximately 50-85% of meals at this time. Labs and medications reviewed.   No nutrition interventions warranted at this time. If nutrition issues arise, please consult RD.   Tamiyah Moulin A. Mayford KnifeWilliams, RD, LDN, CDE Pager: [phone] After hours Pager: [phone]

## 2018-03-16 NOTE — Care Management Important Message (Signed)
Copy of signed Medicare IM left with patient in room. 

## 2018-03-16 NOTE — Progress Notes (Signed)
Patient ID: Stephanie Duarte, female   DOB: 01/17/1929, 85 y.o.   MRN: [id number]   [employer] PROGRESS NOTE  Stephanie Duarte:[id number] DOB: [birth year] DOA: 03/12/2018 PCP: Raynelle Bring  HPI/Subjective: Patient has dementia.  Had some diarrhea yesterday but I stopped the MiraLAX and no further diarrhea.  Objective: Vitals:   03/16/18 1100 03/16/18 1216  BP:  136/77  Pulse: 69 70  Resp:  (!) 21  Temp:  98.2 F (36.8 C)  SpO2: 90% 95%    Filed Weights   03/14/18 0500 03/15/18 0500 03/16/18 0500  Weight: 72 kg 71.1 kg 72 kg    ROS: Review of Systems  Unable to perform ROS: Dementia  Respiratory: Negative for cough and shortness of breath.    Exam: Physical Exam  HENT:  Nose: No mucosal edema.  Mouth/Throat: No oropharyngeal exudate or posterior oropharyngeal edema.  Eyes: Pupils are equal, round, and reactive to light. Conjunctivae, EOM and lids are normal.  Neck: No JVD present. Carotid bruit is not present. No edema present. No thyroid mass and no thyromegaly present.  Cardiovascular: S1 normal and S2 normal. Exam reveals no gallop.  No murmur heard. Pulses:      Dorsalis pedis pulses are 2+ on the right side and 2+ on the left side.  Respiratory: No respiratory distress. She has decreased breath sounds in the right lower field and the left lower field. She has wheezes in the right middle field, the right lower field and the left lower field. She has no rhonchi. She has no rales.  GI: Soft. Bowel sounds are normal. There is no abdominal tenderness.  Musculoskeletal:     Right ankle: She exhibits no swelling.     Left ankle: She exhibits no swelling.  Lymphadenopathy:    She has no cervical adenopathy.  Neurological: She is alert.  Skin: Skin is warm. No rash noted. Nails show no clubbing.  Psychiatric: She has a normal mood and affect.      Data Reviewed: Basic Metabolic Panel: Recent Labs  Lab 03/12/18 1648 03/13/18 0551 03/14/18 0436  NA 140  140 141  K 4.4 4.4 4.4  CL 102 103 105  CO2 29 27 29   GLUCOSE 154* 230* 197*  BUN 21 21 31*  CREATININE 1.13* 1.04* 1.06*  CALCIUM 9.3 8.8* 8.8*  MG  --   --  2.1   CBC: Recent Labs  Lab 03/12/18 1648 03/13/18 0551 03/14/18 0436  WBC 8.9 7.1 16.7*  HGB 13.6 12.0 11.3*  HCT 43.9 38.5 36.1  MCV 97.8 96.3 97.0  PLT 205 176 182   Cardiac Enzymes: Recent Labs  Lab 03/12/18 1648  TROPONINI <0.03    CBG: Recent Labs  Lab 03/15/18 1518 03/15/18 1700 03/15/18 2109 03/16/18 0727 03/16/18 1200  GLUCAP 109* 242* 239* 127* 175*    Recent Results (from the past 240 hour(s))  Blood Culture (routine x 2)     Status: None (Preliminary result)   Collection Time: 03/12/18  6:25 PM  Result Value Ref Range Status   Specimen Description BLOOD LEFT ANTECUBITAL  Final   Special Requests   Final    BOTTLES DRAWN AEROBIC AND ANAEROBIC Blood Culture adequate volume   Culture   Final    NO GROWTH 4 DAYS Performed at [facility], [address] [place], [place] [address]    Report Status PENDING  Incomplete  Blood Culture (routine x 2)     Status: None (Preliminary result)   Collection  Time: 03/12/18  6:30 PM  Result Value Ref Range Status   Specimen Description BLOOD BLOOD LEFT WRIST  Final   Special Requests   Final    BOTTLES DRAWN AEROBIC AND ANAEROBIC Blood Culture adequate volume   Culture   Final    NO GROWTH 4 DAYS Performed at [facility], [address] [place], [place] [address]    Report Status PENDING  Incomplete  MRSA PCR Screening     Status: None   Collection Time: 03/13/18 12:15 AM  Result Value Ref Range Status   MRSA by PCR NEGATIVE NEGATIVE Final    Comment:        The GeneXpert MRSA Assay (FDA approved for NASAL specimens only), is one component of a comprehensive MRSA colonization surveillance program. It is not intended to diagnose MRSA infection nor to guide or monitor treatment for MRSA infections. Performed at [facility], [address] [place], [place] [address]       Scheduled Meds: . albuterol  2.5 mg Nebulization Q6H  . allopurinol  100 mg Oral Daily  . aspirin  81 mg Oral Daily  . budesonide (PULMICORT) nebulizer solution  0.5 mg Nebulization BID  . citalopram  20 mg Oral Daily  . donepezil  5 mg Oral QHS  . enoxaparin (LOVENOX) injection  40 mg Subcutaneous Q24H  . furosemide  20 mg Oral Daily  . insulin aspart  0-9 Units Subcutaneous TID WC  . levothyroxine  50 mcg Oral Q0600  . memantine  10 mg Oral BID  . methylPREDNISolone (SOLU-MEDROL) injection  60 mg Intravenous Daily  . potassium chloride  10 mEq Oral Daily  . pravastatin  40 mg Oral Daily   Continuous Infusions: . sodium chloride Stopped (03/14/18 1056)  . cefTRIAXone (ROCEPHIN)  IV 1 g (03/16/18 0824)  . liver oil-zinc oxide      Assessment/Plan:  1. Acute hypoxic respiratory failure.  Today's pulse ox on room air is good.  Patient off oxygen. 2. Left lower lobe pneumonia on Rocephin and Zithromax.  Repeat chest x-ray today does not show pneumonia.  Patient should finish up Zithromax today. 3. Asthma exacerbation.  Continue daily Solu-Medrol and nebulizer treatments with DuoNeb and budesonide. 4. Dementia.  On Namenda 5. Chronic kidney disease stage III 6. Hyperlipidemia unspecified on pravastatin 7. Hypothyroidism on levothyroxine 8. History of gout on allopurinol 9. PT evaluation 10. Redness on the buttock.  Desitin ordered  Code Status:     Code Status Orders  (From admission, onward)         Start     Ordered   03/12/18 1929  Do not attempt resuscitation (DNR)  Continuous    Question Answer Comment  In the event of cardiac or respiratory ARREST Do not call a "code blue"   In the event of cardiac or respiratory ARREST Do not perform Intubation, CPR, defibrillation or ACLS   In the event of cardiac or respiratory ARREST Use medication by any route, position, wound care, and other measures to relive  pain and suffering. May use oxygen, suction and manual treatment of airway obstruction as needed for comfort.      03/12/18 1930        Code Status History    Date Active Date Inactive Code Status Order ID Comments User Context   07/04/2015 1403 07/07/2015 1850 DNR [id number]  Katha HammingKonidena, Snehalatha, MD ED    Advance Directive Documentation     Most Recent Value  Type of  Customer service managerAdvance Directive  Healthcare Power of Attorney, Living will  Pre-existing out of facility DNR order (yellow form or pink MOST form)  -  "MOST" Form in Place?  -     Family Communication: Spoke with daughter on the phone Disposition Plan: Reevaluate daily on when to get out of the hospital  Antibiotics:  Rocephin  Zithromax should finish up today  Time spent: 25 minutes  Stephanie Duarte  [employer]

## 2018-03-16 NOTE — Progress Notes (Signed)
Attempted multiple times to place O2 on pt. Pt refuses to leave it on. RA sat of 90% taken per progression report and notified MD via secure chat.

## 2018-03-17 LAB — GLUCOSE, CAPILLARY
Glucose-Capillary: 110 mg/dL — ABNORMAL HIGH (ref 70–99)
Glucose-Capillary: 193 mg/dL — ABNORMAL HIGH (ref 70–99)

## 2018-03-17 LAB — CULTURE, BLOOD (ROUTINE X 2)
Culture: NO GROWTH
Culture: NO GROWTH
Special Requests: ADEQUATE
Special Requests: ADEQUATE

## 2018-03-17 MED ORDER — BUDESONIDE 0.5 MG/2ML IN SUSP: RESPIRATORY_TRACT | 0 refills | Status: DC

## 2018-03-17 MED ORDER — ZINC OXIDE 40 % EX OINT: TOPICAL_OINTMENT | CUTANEOUS | 0 refills | Status: DC | PRN

## 2018-03-17 MED ORDER — ALBUTEROL SULFATE (2.5 MG/3ML) 0.083% IN NEBU: INHALATION_SOLUTION | RESPIRATORY_TRACT | 0 refills | Status: DC

## 2018-03-17 NOTE — Care Management (Signed)
Patient discharged back to [facility] care today.  Bedside RN check RA sats prior to discharge, and patient did not require O2 at time of discharge.  CSW confirmed with facility that patient as nebulizer.

## 2018-03-17 NOTE — Discharge Planning (Addendum)
Patient's IV removed.  RN assessment and VS revealed stability for DC back to [facility].  Discharge printed and placed in facility packet with signed [facility] DNR.  Facility came and transported back via [employer].

## 2018-03-17 NOTE — Discharge Summary (Signed)
[facility] - [place] at [facility]   PATIENT NAME: Stephanie Duarte    MR#:  [id number]  DATE OF BIRTH:  05/01/1928  DATE OF ADMISSION:  03/12/2018 ADMITTING PHYSICIAN: Milagros LollSrikar Sudini, MD  DATE OF DISCHARGE: 03/17/2018  PRIMARY CARE PHYSICIAN: [facility]    ADMISSION DIAGNOSIS:  Bronchospasm with bronchitis, acute [J20.9] Community acquired pneumonia of left lower lobe of lung (HCC) [J18.1]  DISCHARGE DIAGNOSIS:  Asthmatic bronchitis  SECONDARY DIAGNOSIS:   Past Medical History:  Diagnosis Date  . Asthma   . Dementia (HCC)   . Hyperlipidemia   . Hypertension     HOSPITAL COURSE:   1.  Acute hypoxic respiratory failure during the hospital stay.  Yesterday's pulse ox was good and off oxygen. 2.  Asthma exacerbation with bronchitis with suspected left lower lobe pneumonia.  The patient received a full course of Zithromax and is receiving.  Rocephin today.  Repeat chest x-ray did not show a pneumonia.  Patient received 5 days of steroids while here in the hospital.  We will stop steroids and antibiotics at this time.  Continue nebulizer treatments as outpatient.  Can stop budesonide in 1 week.  Today's the first day that I did not hear a wheeze. 3.  Dementia on Namenda 4.  Chronic kidney disease stage III 5.  Hyperlipidemia unspecified on pravastatin 6.  Hypothyroidism unspecified on levothyroxine 7.  History of gout on allopurinol 8.  Redness on the buttock Desitin ordered 9.  Some diarrhea while here.  Unable to send stool studies because it is mixed with urine but stool is starting to form up as per nursing staff.  I think this was secondary to the MiraLAX that we were giving on a daily basis while she was here.  I stopped anticonstipation medications at this time.  We will stop antibiotics at this time.  Patient is eating and drinking well. 10.  Borderline diabetic with elevated sugars secondary to steroids.  Hemoglobin A1c 6.7.  No need for medications at  this time.  DISCHARGE CONDITIONS:  Satisfactory  CONSULTS OBTAINED:  None  DRUG ALLERGIES:   Allergies  Allergen Reactions  . Meloxicam Swelling    DISCHARGE MEDICATIONS:   Allergies as of 03/17/2018      Reactions   Meloxicam Swelling      Medication List    STOP taking these medications   albuterol 0.63 MG/3ML nebulizer solution Commonly known as:  ACCUNEB Replaced by:  albuterol (2.5 MG/3ML) 0.083% nebulizer solution   bisacodyl 5 MG EC tablet Commonly known as:  DULCOLAX   polyethylene glycol powder powder Commonly known as:  GLYCOLAX/MIRALAX     TAKE these medications   acetaminophen 325 MG tablet Commonly known as:  TYLENOL Take 2 tablets (650 mg total) by mouth every 6 (six) hours as needed for mild pain.   albuterol (2.5 MG/3ML) 0.083% nebulizer solution Commonly known as:  PROVENTIL Dx J44.1 Replaces:  albuterol 0.63 MG/3ML nebulizer solution   allopurinol 100 MG tablet Commonly known as:  ZYLOPRIM Take 100 mg by mouth daily.   aspirin 81 MG chewable tablet Chew 81 mg by mouth daily.   budesonide 0.5 MG/2ML nebulizer solution Commonly known as:  PULMICORT Dx J44.1   cholecalciferol 1000 units tablet Commonly known as:  VITAMIN D Take 1,000 Units by mouth daily.   citalopram 20 MG tablet Commonly known as:  CELEXA Take 20 mg by mouth daily.   donepezil 5 MG tablet Commonly known as:  ARICEPT Take 5 mg  by mouth at bedtime.   eucerin cream Apply topically 4 (four) times daily as needed.   furosemide 20 MG tablet Commonly known as:  LASIX Take 20 mg by mouth daily.   guaiFENesin-dextromethorphan 100-10 MG/5ML syrup Commonly known as:  ROBITUSSIN DM Take 5 mLs by mouth every 6 (six) hours as needed.   levothyroxine 50 MCG tablet Commonly known as:  SYNTHROID, LEVOTHROID Take 50 mcg by mouth every morning.   liver oil-zinc oxide 40 % ointment Commonly known as:  DESITIN Apply topically continuous as needed for irritation.    memantine 10 MG tablet Commonly known as:  NAMENDA Take 10 mg by mouth 2 (two) times daily.   metFORMIN 500 MG tablet Commonly known as:  GLUCOPHAGE Take 500 mg by mouth daily.   potassium chloride 10 MEQ tablet Commonly known as:  K-DUR Take 10 mEq by mouth daily.   pravastatin 40 MG tablet Commonly known as:  PRAVACHOL Take 40 mg by mouth daily.   PRESERVISION AREDS Caps Take 1 capsule by mouth 2 (two) times daily.        DISCHARGE INSTRUCTIONS:   Follow-up PMD 5 days  If you experience worsening of your admission symptoms, develop shortness of breath, life threatening emergency, suicidal or homicidal thoughts you must seek medical attention immediately by calling 911 or calling your MD immediately  if symptoms less severe.  You Must read complete instructions/literature along with all the possible adverse reactions/side effects for all the Medicines you take and that have been prescribed to you. Take any new Medicines after you have completely understood and accept all the possible adverse reactions/side effects.   Please note  You were cared for by a hospitalist during your hospital stay. If you have any questions about your discharge medications or the care you received while you were in the hospital after you are discharged, you can call the unit and asked to speak with the hospitalist on call if the hospitalist that took care of you is not available. Once you are discharged, your primary care physician will handle any further medical issues. Please note that NO REFILLS for any discharge medications will be authorized once you are discharged, as it is imperative that you return to your primary care physician (or establish a relationship with a primary care physician if you do not have one) for your aftercare needs so that they can reassess your need for medications and monitor your lab values.    Today   CHIEF COMPLAINT:   Chief Complaint  Patient presents with  .  Shortness of Breath    HISTORY OF PRESENT ILLNESS:  Stephanie Duarte  is a 85 y.o. female presented with shortness of breath   VITAL SIGNS:  Blood pressure 138/72, pulse 69, temperature 98.1 F (36.7 C), temperature source Oral, resp. rate 20, height 4\' 10"  (1.473 m), weight 75.8 kg, SpO2 97 %.    PHYSICAL EXAMINATION:  GENERAL:  85 y.o.-year-old patient lying in the bed with no acute distress.  EYES: Pupils equal, round, reactive to light and accommodation. No scleral icterus. HEENT: Head atraumatic, normocephalic. Oropharynx and nasopharynx clear.  NECK:  Supple, no jugular venous distention. No thyroid enlargement, no tenderness.  LUNGS: Normal breath sounds bilaterally, no wheezing, rales,rhonchi or crepitation. No use of accessory muscles of respiration.  CARDIOVASCULAR: S1, S2 normal. No murmurs, rubs, or gallops.  ABDOMEN: Soft, non-tender, non-distended. Bowel sounds present. No organomegaly or mass.  EXTREMITIES: Trace pedal edema, no cyanosis, or clubbing.  NEUROLOGIC: Cranial  nerves II through XII are intact. Sensation intact. Gait not checked.  PSYCHIATRIC: The patient is alert and answers some yes or no questions.  SKIN: As per nursing staff some redness on the buttock  DATA REVIEW:   CBC Recent Labs  Lab 03/14/18 0436  WBC 16.7*  HGB 11.3*  HCT 36.1  PLT 182    Chemistries  Recent Labs  Lab 03/14/18 0436  NA 141  K 4.4  CL 105  CO2 29  GLUCOSE 197*  BUN 31*  CREATININE 1.06*  CALCIUM 8.8*  MG 2.1    Cardiac Enzymes Recent Labs  Lab 03/12/18 1648  TROPONINI <0.03    Microbiology Results  Results for orders placed or performed during the hospital encounter of 03/12/18  Blood Culture (routine x 2)     Status: None   Collection Time: 03/12/18  6:25 PM  Result Value Ref Range Status   Specimen Description BLOOD LEFT ANTECUBITAL  Final   Special Requests   Final    BOTTLES DRAWN AEROBIC AND ANAEROBIC Blood Culture adequate volume   Culture    Final    NO GROWTH 5 DAYS Performed at [facility], [address] [place], [place] [address]    Report Status 03/17/2018 FINAL  Final  Blood Culture (routine x 2)     Status: None   Collection Time: 03/12/18  6:30 PM  Result Value Ref Range Status   Specimen Description BLOOD BLOOD LEFT WRIST  Final   Special Requests   Final    BOTTLES DRAWN AEROBIC AND ANAEROBIC Blood Culture adequate volume   Culture   Final    NO GROWTH 5 DAYS Performed at [facility], [address] [place], [place] [address]    Report Status 03/17/2018 FINAL  Final  MRSA PCR Screening     Status: None   Collection Time: 03/13/18 12:15 AM  Result Value Ref Range Status   MRSA by PCR NEGATIVE NEGATIVE Final    Comment:        The GeneXpert MRSA Assay (FDA approved for NASAL specimens only), is one component of a comprehensive MRSA colonization surveillance program. It is not intended to diagnose MRSA infection nor to guide or monitor treatment for MRSA infections. Performed at [facility], [address] [place], [place] [address]     RADIOLOGY:  Dg Chest Port 1 View  Result Date: 03/16/2018 CLINICAL DATA:  85 year old female with shortness of breath. Advanced dementia. Subsequent encounter. EXAM: PORTABLE CHEST 1 VIEW COMPARISON:  03/12/2018 and 03/12/2017 chest x-ray. FINDINGS: Chronically elevated left hemidiaphragm. Scarring left base similar to 03/22/2017 exam. Minimal bibasilar atelectasis. No pulmonary edema. Heart size top-normal. Calcified mildly tortuous aorta. Shoulder joint degenerative changes. IMPRESSION: 1. Chronically elevated left hemidiaphragm. 2. Scarring left lung base similar to 03/12/2017 exam. 3. Minimal bibasilar atelectasis. 4. Heart size top-normal. 5.  Aortic Atherosclerosis (ICD10-I70.0). Electronically Signed   By: Lacy DuverneySteven  Olson M.D.   On: 03/16/2018 07:24     Management plans discussed with the patient, family and they are in  agreement.  CODE STATUS:     Code Status Orders  (From admission, onward)         Start     Ordered   03/12/18 1929  Do not attempt resuscitation (DNR)  Continuous    Question Answer Comment  In the event of cardiac or respiratory ARREST Do not call a "code blue"   In the event of cardiac or respiratory ARREST Do not perform  Intubation, CPR, defibrillation or ACLS   In the event of cardiac or respiratory ARREST Use medication by any route, position, wound care, and other measures to relive pain and suffering. May use oxygen, suction and manual treatment of airway obstruction as needed for comfort.      03/12/18 1930        Code Status History    Date Active Date Inactive Code Status Order ID Comments User Context   07/04/2015 1403 07/07/2015 1850 DNR [id number]  Katha Hamming, MD ED    Advance Directive Documentation     Most Recent Value  Type of Advance Directive  Healthcare Power of Attorney, Living will  Pre-existing out of facility DNR order (yellow form or pink MOST form)  -  "MOST" Form in Place?  -      TOTAL TIME TAKING CARE OF THIS PATIENT: 32 minutes.    Alford Highland M.D on 03/17/2018 at 9:30 AM  Between 7am to 6pm - Pager - [phone]  After 6pm go to [email or web address] - Social research officer, government  [employer] [facility] Office  [phone]  CC: Primary care physician; Raynelle Bring

## 2018-03-17 NOTE — Discharge Instructions (Signed)
Community-Acquired Pneumonia, Adult °Pneumonia is an infection of the lungs. There are different types of pneumonia. One type can develop while a person is in a hospital. A different type, called community-acquired pneumonia, develops in people who are not, or have not recently been, in the hospital or other health care facility. °What are the causes? ° °Pneumonia may be caused by bacteria, viruses, or funguses. Community-acquired pneumonia is often caused by Streptococcus pneumonia bacteria. These bacteria are often passed from one person to another by breathing in droplets from the cough or sneeze of an infected person. °What increases the risk? °The condition is more likely to develop in: °· People who have chronic diseases, such as chronic obstructive pulmonary disease (COPD), asthma, congestive heart failure, cystic fibrosis, diabetes, or kidney disease. °· People who have early-stage or late-stage HIV. °· People who have sickle cell disease. °· People who have had their spleen removed (splenectomy). °· People who have poor dental hygiene. °· People who have medical conditions that increase the risk of breathing in (aspirating) secretions their own mouth and nose. °· People who have a weakened immune system (immunocompromised). °· People who smoke. °· People who travel to areas where pneumonia-causing germs commonly exist. °· People who are around animal habitats or animals that have pneumonia-causing germs, including birds, bats, rabbits, cats, and farm animals. °What are the signs or symptoms? °Symptoms of this condition include: °· A dry cough. °· A wet (productive) cough. °· Fever. °· Sweating. °· Chest pain, especially when breathing deeply or coughing. °· Rapid breathing or difficulty breathing. °· Shortness of breath. °· Shaking chills. °· Fatigue. °· Muscle aches. °How is this diagnosed? °Your health care provider will take a medical history and perform a physical exam. You may also have other tests,  including: °· Imaging studies of your chest, including X-rays. °· Tests to check your blood oxygen level and other blood gases. °· Other tests on blood, mucus (sputum), fluid around your lungs (pleural fluid), and urine. °If your pneumonia is severe, other tests may be done to identify the specific cause of your illness. °How is this treated? °The type of treatment that you receive depends on many factors, such as the cause of your pneumonia, the medicines you take, and other medical conditions that you have. For most adults, treatment and recovery from pneumonia may occur at home. In some cases, treatment must happen in a hospital. Treatment may include: °· Antibiotic medicines, if the pneumonia was caused by bacteria. °· Antiviral medicines, if the pneumonia was caused by a virus. °· Medicines that are given by mouth or through an IV tube. °· Oxygen. °· Respiratory therapy. °Although rare, treating severe pneumonia may include: °· Mechanical ventilation. This is done if you are not breathing well on your own and you cannot maintain a safe blood oxygen level. °· Thoracentesis. This procedure removes fluid around one lung or both lungs to help you breathe better. °Follow these instructions at home: ° °· Take over-the-counter and prescription medicines only as told by your health care provider. °? Only take cough medicine if you are losing sleep. Understand that cough medicine can prevent your body’s natural ability to remove mucus from your lungs. °? If you were prescribed an antibiotic medicine, take it as told by your health care provider. Do not stop taking the antibiotic even if you start to feel better. °· Sleep in a semi-upright position at night. Try sleeping in a reclining chair, or place a few pillows under your head. °· Do not use tobacco products, including cigarettes, chewing tobacco, and e-cigarettes.   If you need help quitting, ask your health care provider. °· Drink enough water to keep your urine  clear or pale yellow. This will help to thin out mucus secretions in your lungs. °How is this prevented? °There are ways that you can decrease your risk of developing community-acquired pneumonia. Consider getting a pneumococcal vaccine if: °· You are older than 83 years of age. °· You are older than 83 years of age and are undergoing cancer treatment, have chronic lung disease, or have other medical conditions that affect your immune system. Ask your health care provider if this applies to you. °There are different types and schedules of pneumococcal vaccines. Ask your health care provider which vaccination option is best for you. °You may also prevent community-acquired pneumonia if you take these actions: °· Get an influenza vaccine every year. Ask your health care provider which type of influenza vaccine is best for you. °· Go to the dentist on a regular basis. °· Wash your hands often. Use hand sanitizer if soap and water are not available. °Contact a health care provider if: °· You have a fever. °· You are losing sleep because you cannot control your cough with cough medicine. °Get help right away if: °· You have worsening shortness of breath. °· You have increased chest pain. °· Your sickness becomes worse, especially if you are an older adult or have a weakened immune system. °· You cough up blood. °This information is not intended to replace advice given to you by your health care provider. Make sure you discuss any questions you have with your health care provider. °Document Released: 02/25/2005 Document Revised: 11/14/2016 Document Reviewed: 06/22/2014 °Elsevier Interactive Patient Education © 2019 [employer] ° °

## 2018-03-17 NOTE — Plan of Care (Signed)
Patient continues to be incontinent - making collection of an uncontaminated stool most likely will be unattainable. However, stools are beginning to solidify.  Dr. Eather Colas.

## 2018-03-24 DIAGNOSIS — J45909 Unspecified asthma, uncomplicated: Secondary | ICD-10-CM | POA: Diagnosis not present

## 2018-03-24 DIAGNOSIS — G309 Alzheimer's disease, unspecified: Secondary | ICD-10-CM | POA: Diagnosis not present

## 2018-03-24 DIAGNOSIS — E039 Hypothyroidism, unspecified: Secondary | ICD-10-CM | POA: Diagnosis not present

## 2018-03-24 DIAGNOSIS — D72829 Elevated white blood cell count, unspecified: Secondary | ICD-10-CM | POA: Diagnosis not present

## 2018-04-01 DIAGNOSIS — G309 Alzheimer's disease, unspecified: Secondary | ICD-10-CM | POA: Diagnosis not present

## 2018-04-01 DIAGNOSIS — F0280 Dementia in other diseases classified elsewhere without behavioral disturbance: Secondary | ICD-10-CM | POA: Diagnosis not present

## 2018-04-01 DIAGNOSIS — R262 Difficulty in walking, not elsewhere classified: Secondary | ICD-10-CM | POA: Diagnosis not present

## 2018-04-11 ENCOUNTER — Inpatient Hospital Stay
Admission: EM | Admit: 2018-04-11 | Discharge: 2018-04-13 | DRG: 202 | Disposition: A | Discharge status: Home Health | Payer: Medicare Other | Source: Skilled Nursing Facility | Attending: Internal Medicine | Admitting: Internal Medicine

## 2018-04-11 ENCOUNTER — Emergency Department: Payer: Medicare Other

## 2018-04-11 ENCOUNTER — Other Ambulatory Visit: Payer: Self-pay

## 2018-04-11 ENCOUNTER — Encounter: Payer: Self-pay | Admitting: Emergency Medicine

## 2018-04-11 DIAGNOSIS — Z993 Dependence on wheelchair: Secondary | ICD-10-CM

## 2018-04-11 DIAGNOSIS — Z7982 Long term (current) use of aspirin: Secondary | ICD-10-CM

## 2018-04-11 DIAGNOSIS — Z888 Allergy status to other drugs, medicaments and biological substances status: Secondary | ICD-10-CM | POA: Diagnosis not present

## 2018-04-11 DIAGNOSIS — M2550 Pain in unspecified joint: Secondary | ICD-10-CM | POA: Diagnosis not present

## 2018-04-11 DIAGNOSIS — N179 Acute kidney failure, unspecified: Secondary | ICD-10-CM | POA: Diagnosis present

## 2018-04-11 DIAGNOSIS — R062 Wheezing: Secondary | ICD-10-CM | POA: Diagnosis not present

## 2018-04-11 DIAGNOSIS — R0902 Hypoxemia: Secondary | ICD-10-CM | POA: Diagnosis not present

## 2018-04-11 DIAGNOSIS — Z79899 Other long term (current) drug therapy: Secondary | ICD-10-CM

## 2018-04-11 DIAGNOSIS — J9601 Acute respiratory failure with hypoxia: Secondary | ICD-10-CM | POA: Diagnosis not present

## 2018-04-11 DIAGNOSIS — I10 Essential (primary) hypertension: Secondary | ICD-10-CM | POA: Diagnosis present

## 2018-04-11 DIAGNOSIS — E785 Hyperlipidemia, unspecified: Secondary | ICD-10-CM | POA: Diagnosis present

## 2018-04-11 DIAGNOSIS — Z7401 Bed confinement status: Secondary | ICD-10-CM | POA: Diagnosis not present

## 2018-04-11 DIAGNOSIS — J45901 Unspecified asthma with (acute) exacerbation: Secondary | ICD-10-CM | POA: Diagnosis not present

## 2018-04-11 DIAGNOSIS — E039 Hypothyroidism, unspecified: Secondary | ICD-10-CM | POA: Diagnosis present

## 2018-04-11 DIAGNOSIS — Z7984 Long term (current) use of oral hypoglycemic drugs: Secondary | ICD-10-CM | POA: Diagnosis not present

## 2018-04-11 DIAGNOSIS — E119 Type 2 diabetes mellitus without complications: Secondary | ICD-10-CM | POA: Diagnosis present

## 2018-04-11 DIAGNOSIS — J9600 Acute respiratory failure, unspecified whether with hypoxia or hypercapnia: Secondary | ICD-10-CM | POA: Diagnosis not present

## 2018-04-11 DIAGNOSIS — R404 Transient alteration of awareness: Secondary | ICD-10-CM | POA: Diagnosis not present

## 2018-04-11 DIAGNOSIS — Z66 Do not resuscitate: Secondary | ICD-10-CM | POA: Diagnosis present

## 2018-04-11 DIAGNOSIS — F0390 Unspecified dementia without behavioral disturbance: Secondary | ICD-10-CM | POA: Diagnosis not present

## 2018-04-11 DIAGNOSIS — G309 Alzheimer's disease, unspecified: Secondary | ICD-10-CM | POA: Diagnosis present

## 2018-04-11 DIAGNOSIS — Z7951 Long term (current) use of inhaled steroids: Secondary | ICD-10-CM

## 2018-04-11 DIAGNOSIS — R0602 Shortness of breath: Secondary | ICD-10-CM | POA: Diagnosis not present

## 2018-04-11 DIAGNOSIS — J4541 Moderate persistent asthma with (acute) exacerbation: Principal | ICD-10-CM | POA: Diagnosis present

## 2018-04-11 DIAGNOSIS — F329 Major depressive disorder, single episode, unspecified: Secondary | ICD-10-CM | POA: Diagnosis present

## 2018-04-11 LAB — COMPREHENSIVE METABOLIC PANEL
ALT: 56 U/L — ABNORMAL HIGH (ref 0–44)
AST: 59 U/L — ABNORMAL HIGH (ref 15–41)
Albumin: 3.7 g/dL (ref 3.5–5.0)
Alkaline Phosphatase: 80 U/L (ref 38–126)
Anion gap: 9 (ref 5–15)
BILIRUBIN TOTAL: 0.7 mg/dL (ref 0.3–1.2)
BUN: 17 mg/dL (ref 8–23)
CO2: 28 mmol/L (ref 22–32)
Calcium: 8.8 mg/dL — ABNORMAL LOW (ref 8.9–10.3)
Chloride: 101 mmol/L (ref 98–111)
Creatinine, Ser: 0.84 mg/dL (ref 0.44–1.00)
GFR calc Af Amer: >60 mL/min (ref >60)
GFR calc non Af Amer: >60 mL/min (ref >60)
Glucose, Bld: 193 mg/dL — ABNORMAL HIGH (ref 70–99)
Potassium: 3.9 mmol/L (ref 3.5–5.1)
Sodium: 138 mmol/L (ref 135–145)
Total Protein: 7.1 g/dL (ref 6.5–8.1)

## 2018-04-11 LAB — CBC WITH DIFFERENTIAL/PLATELET
Abs Immature Granulocytes: 0.04 10*3/uL (ref 0.00–0.07)
Basophils Absolute: 0.1 10*3/uL (ref 0.0–0.1)
Basophils Relative: 1 %
EOS ABS: 0.1 10*3/uL (ref 0.0–0.5)
EOS PCT: 2 %
HCT: 41.9 % (ref 36.0–46.0)
Hemoglobin: 13.2 g/dL (ref 12.0–15.0)
Immature Granulocytes: 1 %
Lymphocytes Relative: 44 %
Lymphs Abs: 2.8 10*3/uL (ref 0.7–4.0)
MCH: 30.8 pg (ref 26.0–34.0)
MCHC: 31.5 g/dL (ref 30.0–36.0)
MCV: 97.7 fL (ref 80.0–100.0)
Monocytes Absolute: 0.6 10*3/uL (ref 0.1–1.0)
Monocytes Relative: 10 %
Neutro Abs: 2.6 10*3/uL (ref 1.7–7.7)
Neutrophils Relative %: 42 %
Platelets: 196 10*3/uL (ref 150–400)
RBC: 4.29 MIL/uL (ref 3.87–5.11)
RDW: 14.8 % (ref 11.5–15.5)
WBC: 6.2 10*3/uL (ref 4.0–10.5)
nRBC: 0 % (ref 0.0–0.2)

## 2018-04-11 LAB — BLOOD GAS, VENOUS
Acid-Base Excess: 6 mmol/L — ABNORMAL HIGH (ref 0.0–2.0)
Bicarbonate: 32.2 mmol/L — ABNORMAL HIGH (ref 20.0–28.0)
O2 SAT: 69 %
PATIENT TEMPERATURE: 37
pCO2, Ven: 52 mmHg (ref 44.0–60.0)
pH, Ven: 7.4 (ref 7.250–7.430)
pO2, Ven: 36 mmHg (ref 32.0–45.0)

## 2018-04-11 LAB — GLUCOSE, CAPILLARY
Glucose-Capillary: 257 mg/dL — ABNORMAL HIGH (ref 70–99)
Glucose-Capillary: 356 mg/dL — ABNORMAL HIGH (ref 70–99)
Glucose-Capillary: 234 mg/dL — ABNORMAL HIGH (ref 70–99)

## 2018-04-11 LAB — MRSA PCR SCREENING: MRSA by PCR: NEGATIVE

## 2018-04-11 LAB — INFLUENZA PANEL BY PCR (TYPE A & B)
Influenza A By PCR: NEGATIVE
Influenza B By PCR: NEGATIVE

## 2018-04-11 MED ORDER — ACETAMINOPHEN 325 MG PO TABS: 650.0000 mg | ORAL_TABLET | Freq: Four times a day (QID) | ORAL | Status: DC | PRN

## 2018-04-11 MED ORDER — MEMANTINE HCL 5 MG PO TABS
10.0000 mg | ORAL_TABLET | Freq: Two times a day (BID) | ORAL | Status: DC
  Administered 2018-04-11 – 2018-04-13 (×3): 10 mg via ORAL
  Filled 2018-04-11 (×4): qty 2

## 2018-04-11 MED ORDER — POLYETHYLENE GLYCOL 3350 17 G PO PACK: 17.0000 g | PACK | Freq: Every day | ORAL | Status: DC | PRN

## 2018-04-11 MED ORDER — METHYLPREDNISOLONE SODIUM SUCC 40 MG IJ SOLR
40.0000 mg | Freq: Two times a day (BID) | INTRAMUSCULAR | Status: DC
  Administered 2018-04-11 – 2018-04-13 (×5): 40 mg via INTRAVENOUS
  Filled 2018-04-11 (×5): qty 1

## 2018-04-11 MED ORDER — ONDANSETRON HCL 4 MG/2ML IJ SOLN: 4.0000 mg | Freq: Four times a day (QID) | INTRAMUSCULAR | Status: DC | PRN

## 2018-04-11 MED ORDER — ACETAMINOPHEN 650 MG RE SUPP: 650.0000 mg | Freq: Four times a day (QID) | RECTAL | Status: DC | PRN

## 2018-04-11 MED ORDER — IPRATROPIUM-ALBUTEROL 0.5-2.5 (3) MG/3ML IN SOLN
3.0000 mL | Freq: Once | RESPIRATORY_TRACT | Status: AC
  Administered 2018-04-11: 3 mL via RESPIRATORY_TRACT
  Filled 2018-04-11: qty 3

## 2018-04-11 MED ORDER — CITALOPRAM HYDROBROMIDE 20 MG PO TABS
20.0000 mg | ORAL_TABLET | Freq: Every day | ORAL | Status: DC
  Administered 2018-04-13: 20 mg via ORAL
  Filled 2018-04-11 (×2): qty 1

## 2018-04-11 MED ORDER — DONEPEZIL HCL 5 MG PO TABS
5.0000 mg | ORAL_TABLET | Freq: Every day | ORAL | Status: DC
  Administered 2018-04-11 – 2018-04-12 (×2): 5 mg via ORAL
  Filled 2018-04-11 (×3): qty 1

## 2018-04-11 MED ORDER — ORAL CARE MOUTH RINSE: 15.0000 mL | Freq: Two times a day (BID) | OROMUCOSAL | Status: DC

## 2018-04-11 MED ORDER — DOXYCYCLINE HYCLATE 100 MG PO TABS
100.0000 mg | ORAL_TABLET | Freq: Two times a day (BID) | ORAL | Status: DC
  Administered 2018-04-11 (×2): 100 mg via ORAL
  Filled 2018-04-11 (×3): qty 1

## 2018-04-11 MED ORDER — FUROSEMIDE 20 MG PO TABS
20.0000 mg | ORAL_TABLET | Freq: Every day | ORAL | Status: DC
  Filled 2018-04-11: qty 1

## 2018-04-11 MED ORDER — INSULIN ASPART 100 UNIT/ML ~~LOC~~ SOLN
0.0000 [IU] | Freq: Three times a day (TID) | SUBCUTANEOUS | Status: DC
  Administered 2018-04-11: 15 [IU] via SUBCUTANEOUS
  Administered 2018-04-12 (×3): 3 [IU] via SUBCUTANEOUS
  Filled 2018-04-11 (×4): qty 1

## 2018-04-11 MED ORDER — VITAMIN D3 25 MCG (1000 UNIT) PO TABS
1000.0000 [IU] | ORAL_TABLET | Freq: Every day | ORAL | Status: DC
  Administered 2018-04-13: 1000 [IU] via ORAL
  Filled 2018-04-11 (×4): qty 1

## 2018-04-11 MED ORDER — ENOXAPARIN SODIUM 40 MG/0.4ML ~~LOC~~ SOLN
40.0000 mg | SUBCUTANEOUS | Status: DC
  Administered 2018-04-11 – 2018-04-12 (×2): 40 mg via SUBCUTANEOUS
  Filled 2018-04-11 (×2): qty 0.4

## 2018-04-11 MED ORDER — PRAVASTATIN SODIUM 20 MG PO TABS
40.0000 mg | ORAL_TABLET | Freq: Every day | ORAL | Status: DC
  Administered 2018-04-11 – 2018-04-12 (×2): 40 mg via ORAL
  Filled 2018-04-11 (×2): qty 2

## 2018-04-11 MED ORDER — ALLOPURINOL 100 MG PO TABS
100.0000 mg | ORAL_TABLET | Freq: Every day | ORAL | Status: DC
  Administered 2018-04-13: 100 mg via ORAL
  Filled 2018-04-11 (×2): qty 1

## 2018-04-11 MED ORDER — GUAIFENESIN-DM 100-10 MG/5ML PO SYRP
5.0000 mL | ORAL_SOLUTION | Freq: Four times a day (QID) | ORAL | Status: DC | PRN
  Administered 2018-04-11: 5 mL via ORAL
  Filled 2018-04-11: qty 5

## 2018-04-11 MED ORDER — ASPIRIN 81 MG PO CHEW
81.0000 mg | CHEWABLE_TABLET | Freq: Every day | ORAL | Status: DC
  Administered 2018-04-13: 81 mg via ORAL
  Filled 2018-04-11 (×2): qty 1

## 2018-04-11 MED ORDER — ONDANSETRON HCL 4 MG PO TABS: 4.0000 mg | ORAL_TABLET | Freq: Four times a day (QID) | ORAL | Status: DC | PRN

## 2018-04-11 MED ORDER — IPRATROPIUM-ALBUTEROL 0.5-2.5 (3) MG/3ML IN SOLN
3.0000 mL | RESPIRATORY_TRACT | Status: DC
  Administered 2018-04-11 – 2018-04-13 (×8): 3 mL via RESPIRATORY_TRACT
  Filled 2018-04-11 (×8): qty 3

## 2018-04-11 MED ORDER — LEVOTHYROXINE SODIUM 50 MCG PO TABS
50.0000 ug | ORAL_TABLET | ORAL | Status: DC
  Administered 2018-04-12: 50 ug via ORAL
  Filled 2018-04-11: qty 1

## 2018-04-11 NOTE — ED Triage Notes (Addendum)
Pt arrived via EMS from [place] with reports of wheezing that was first noticed by staff, pt was given neb tx, but continued to wheeze after tx.    Per EMS pt had sats in low 90s, pt placed on oxygen, given duoneb and solumedrol by EMS.  Pt is confused at this time.

## 2018-04-11 NOTE — ED Provider Notes (Signed)
[facility] Emergency Department Provider Note    First MD Initiated Contact with Patient 04/11/18 1005     (approximate)  I have reviewed the triage vital signs and the nursing notes.   HISTORY  Chief Complaint Shortness of Breath and Wheezing  Level V Caveat:  dementia  HPI Stephanie Duarte is a 85 y.o. female below listed past medical history presents the ER for worsening shortness of breath wheezing and low-grade temperature.  Patient presenting from [facility].  History limited due to advanced dementia.  Patient arrives found to be hypoxic to 88% on room air.  Does have significant auditory wheezing.    Past Medical History:  Diagnosis Date  . Asthma   . Dementia (HCC)   . Hyperlipidemia   . Hypertension    History reviewed. No pertinent family history. History reviewed. No pertinent surgical history. Patient Active Problem List   Diagnosis Date Noted  . Pneumonia 03/12/2018  . Fever 07/04/2015      Prior to Admission medications   Medication Sig Start Date End Date Taking? Authorizing Provider  acetaminophen (TYLENOL) 325 MG tablet Take 2 tablets (650 mg total) by mouth every 6 (six) hours as needed for mild pain. 07/07/15  Yes Gouru, Deanna ArtisAruna, MD  albuterol (PROVENTIL) (2.5 MG/3ML) 0.083% nebulizer solution Dx J44.1 Patient taking differently: Take 2.5 mg by nebulization 2 (two) times daily.  03/17/18  Yes Wieting, Richard, MD  allopurinol (ZYLOPRIM) 100 MG tablet Take 100 mg by mouth daily.   Yes [provider]  aspirin 81 MG tablet Take 81 mg by mouth daily.    Yes [provider]  cholecalciferol (VITAMIN D) 1000 units tablet Take 1,000 Units by mouth daily.    Yes [provider]  citalopram (CELEXA) 20 MG tablet Take 20 mg by mouth daily.   Yes [provider]  donepezil (ARICEPT) 5 MG tablet Take 5 mg by mouth at bedtime.   Yes [provider]  furosemide (LASIX) 20 MG tablet  Take 20 mg by mouth daily.   Yes [provider]  guaiFENesin-dextromethorphan (ROBITUSSIN DM) 100-10 MG/5ML syrup Take 5 mLs by mouth every 6 (six) hours as needed.   Yes [provider]  levothyroxine (SYNTHROID, LEVOTHROID) 50 MCG tablet Take 50 mcg by mouth every morning.   Yes [provider]  memantine (NAMENDA) 10 MG tablet Take 10 mg by mouth 2 (two) times daily.    Yes [provider]  metFORMIN (GLUCOPHAGE) 500 MG tablet Take 500 mg by mouth daily.   Yes [provider]  potassium chloride (K-DUR) 10 MEQ tablet Take 10 mEq by mouth daily.   Yes [provider]  pravastatin (PRAVACHOL) 40 MG tablet Take 40 mg by mouth daily.   Yes [provider]  Skin Protectants, Misc. (EUCERIN) cream Apply topically 4 (four) times daily as needed.   Yes [provider]  budesonide (PULMICORT) 0.5 MG/2ML nebulizer solution Dx J44.1 03/17/18   Alford HighlandWieting, Richard, MD  liver oil-zinc oxide (DESITIN) 40 % ointment Apply topically continuous as needed for irritation. 03/17/18   Alford HighlandWieting, Richard, MD    Allergies Meloxicam    Social History Social History   Tobacco Use  . Smoking status: Never Smoker  . Smokeless tobacco: Never Used  Substance Use Topics  . Alcohol use: No  . Drug use: Not on file    Review of Systems Patient denies headaches, rhinorrhea, blurry vision, numbness, shortness of breath, chest pain, edema,  cough, abdominal pain, nausea, vomiting, diarrhea, dysuria, fevers, rashes or hallucinations unless otherwise stated above in HPI. ____________________________________________   PHYSICAL EXAM:  VITAL SIGNS: Vitals:   04/11/18 1030 04/11/18 1100  BP: 130/80 120/79  Pulse: 95 94  Resp: (!) 22 (!) 22  Temp:    SpO2: 99% 95%    Constitutional: Alert disoriented in mod distress.  Eyes: Conjunctivae are normal.  Head: Atraumatic. Nose: No congestion/rhinnorhea. Mouth/Throat: Mucous membranes are moist.     Neck: No stridor. Painless ROM.  Cardiovascular: Normal rate, regular rhythm. Grossly normal heart sounds.  Good peripheral circulation. Respiratory: tachypnea with use of accessory muscles.  Expiratory wheeze noted throughout. Gastrointestinal: Soft and nontender. No distention. No abdominal bruits. No CVA tenderness. Genitourinary:  Musculoskeletal: No lower extremity tenderness nor edema.  No joint effusions. Neurologic:   No gross focal neurologic deficits are appreciated. No facial droop Skin:  Skin is warm, dry and intact. No rash noted. Psychiatric: Mood and affect are normal. Speech and behavior are normal.  ____________________________________________   LABS (all labs ordered are listed, but only abnormal results are displayed)  Results for orders placed or performed during the hospital encounter of 04/11/18 (from the past 24 hour(s))  Influenza panel by PCR (type A & B)     Status: None   Collection Time: 04/11/18 10:20 AM  Result Value Ref Range   Influenza A By PCR NEGATIVE NEGATIVE   Influenza B By PCR NEGATIVE NEGATIVE  Blood gas, venous     Status: Abnormal   Collection Time: 04/11/18 10:20 AM  Result Value Ref Range   pH, Ven 7.40 7.250 - 7.430   pCO2, Ven 52 44.0 - 60.0 mmHg   pO2, Ven 36.0 32.0 - 45.0 mmHg   Bicarbonate 32.2 (H) 20.0 - 28.0 mmol/L   Acid-Base Excess 6.0 (H) 0.0 - 2.0 mmol/L   O2 Saturation 69.0 %   Patient temperature 37.0    Collection site VEIN    Sample type VEIN   CBC with Differential/Platelet     Status: None   Collection Time: 04/11/18 10:20 AM  Result Value Ref Range   WBC 6.2 4.0 - 10.5 K/uL   RBC 4.29 3.87 - 5.11 MIL/uL   Hemoglobin 13.2 12.0 - 15.0 g/dL   HCT 40.941.9 81.136.0 - 91.446.0 %   MCV 97.7 80.0 - 100.0 fL   MCH 30.8 26.0 - 34.0 pg   MCHC 31.5 30.0 - 36.0 g/dL   RDW 78.214.8 95.611.5 - 21.315.5 %   Platelets 196 150 - 400 K/uL   nRBC 0.0 0.0 - 0.2 %   Neutrophils Relative % 42 %   Neutro Abs 2.6 1.7 - 7.7 K/uL   Lymphocytes Relative 44  %   Lymphs Abs 2.8 0.7 - 4.0 K/uL   Monocytes Relative 10 %   Monocytes Absolute 0.6 0.1 - 1.0 K/uL   Eosinophils Relative 2 %   Eosinophils Absolute 0.1 0.0 - 0.5 K/uL   Basophils Relative 1 %   Basophils Absolute 0.1 0.0 - 0.1 K/uL   Immature Granulocytes 1 %   Abs Immature Granulocytes 0.04 0.00 - 0.07 K/uL  Comprehensive metabolic panel     Status: Abnormal   Collection Time: 04/11/18 10:20 AM  Result Value Ref Range   Sodium 138 135 - 145 mmol/L   Potassium 3.9 3.5 - 5.1 mmol/L   Chloride 101 98 - 111 mmol/L   CO2 28 22 - 32 mmol/L   Glucose, Bld 193 (H) 70 - 99  mg/dL   BUN 17 8 - 23 mg/dL   Creatinine, Ser 9.60 0.44 - 1.00 mg/dL   Calcium 8.8 (L) 8.9 - 10.3 mg/dL   Total Protein 7.1 6.5 - 8.1 g/dL   Albumin 3.7 3.5 - 5.0 g/dL   AST 59 (H) 15 - 41 U/L   ALT 56 (H) 0 - 44 U/L   Alkaline Phosphatase 80 38 - 126 U/L   Total Bilirubin 0.7 0.3 - 1.2 mg/dL   GFR calc non Af Amer >60 >60 mL/min   GFR calc Af Amer >60 >60 mL/min   Anion gap 9 5 - 15   ____________________________________________  EKG My review and personal interpretation at Time: 10:18   Indication: sob  Rate: 100  Rhythm: sinus Axis: normal Other: nonspecific st abn, no stemi ____________________________________________  RADIOLOGY  I personally reviewed all radiographic images ordered to evaluate for the above acute complaints and reviewed radiology reports and findings.  These findings were personally discussed with the patient.  Please see medical record for radiology report.  ____________________________________________   PROCEDURES  Procedure(s) performed:  .Critical Care Performed by: Willy Eddy, MD Authorized by: Willy Eddy, MD   Critical care provider statement:    Critical care time (minutes):  30   Critical care time was exclusive of:  Separately billable procedures and treating other patients   Critical care was necessary to treat or prevent imminent or life-threatening  deterioration of the following conditions:  Respiratory failure   Critical care was time spent personally by me on the following activities:  Development of treatment plan with patient or surrogate, discussions with consultants, evaluation of patient's response to treatment, examination of patient, obtaining history from patient or surrogate, ordering and performing treatments and interventions, ordering and review of laboratory studies, ordering and review of radiographic studies, pulse oximetry, re-evaluation of patient's condition and review of old charts      Critical Care performed: yes ____________________________________________   INITIAL IMPRESSION / ASSESSMENT AND PLAN / ED COURSE  Pertinent labs & imaging results that were available during my care of the patient were reviewed by me and considered in my medical decision making (see chart for details).   DDX: Asthma, copd, CHF, pna, ptx, malignancy, Pe, anemia   Stephanie Duarte is a 37 y.o. who presents to the ED with wheezing shortness of breath as described above.  Patient low-grade temperature but not actually febrile.  Patient found to be hypoxic on room air requiring supplemental oxygen.  Given her respiratory distress given additional nebulizers.  She received Solu-Medrol in route.  Blood work and radiographs will be ordered to evaluate for the above differential.  Doubt malignancy or PE.  More likely asthma/COPD type of picture.  Clinical Course as of Apr 12 1131  Sat Apr 11, 2018  1129 Patient reassessed.  Blood work fortunately is reassuring.  No hypercapnic respiratory failure but does have persistent hypoxia despite multiple nebulizers.  No evidence of consolidation on chest x-ray.  No leukocytosis.  Flu negative.  No evidence of infectious process at this time.  Seems more consistent with asthma exacerbation.  Given her acute hypoxic respiratory failure will require hospitalization for additional respiratory monitoring and  medical management.   [PR]    Clinical Course User Index [PR] Willy Eddy, MD     As part of my medical decision making, I reviewed the following data within the electronic MEDICAL RECORD NUMBER Nursing notes reviewed and incorporated, Labs reviewed, notes from prior ED visits and  [place] Controlled Substance Database   ____________________________________________   FINAL CLINICAL IMPRESSION(S) / ED DIAGNOSES  Final diagnoses:  Acute respiratory failure with hypoxia (HCC)      NEW MEDICATIONS STARTED DURING THIS VISIT:  New Prescriptions   No medications on file     Note:  This document was prepared using Dragon voice recognition software and may include unintentional dictation errors.    Willy Eddy, MD 04/11/18 1133

## 2018-04-11 NOTE — H&P (Signed)
[facility] [place] [facility]   PATIENT NAME: Stephanie Duarte    MR#:  [id number]  DATE OF BIRTH:  11/13/1928  DATE OF ADMISSION:  04/11/2018  PRIMARY CARE PHYSICIAN: Raynelle Bring   REQUESTING/REFERRING PHYSICIAN: dr Roxan Hockey  CHIEF COMPLAINT:   sob HISTORY OF PRESENT ILLNESS:  Stephanie Duarte  is a 85 y.o. female with a known history of moderate persistent asthma, Alzheimer's dementia, hypothyroidism and diabetes who presents to the emergency room due to shortness of breath and wheezing. Patient with dementia and unable to obtain review systems.  Apparently over the past 24 hours she has had increasing shortness of breath, wheezing and is found to have low oxygen level.  She is a resident at [facility] assisted living/dementia unit.  She is wheelchair-bound and needs assistance with her ADLs.    Patient was in the hospital about a month ago treated for pneumonia. She has been given nebs and steroids however continues to have wheezing.  Influenza testing and chest x-ray are essentially unremarkable. PAST MEDICAL HISTORY:   Past Medical History:  Diagnosis Date  . Asthma   . Dementia (HCC)   . Hyperlipidemia   . Hypertension     PAST SURGICAL HISTORY:  none  SOCIAL HISTORY:   Social History   Tobacco Use  . Smoking status: Never Smoker  . Smokeless tobacco: Never Used  Substance Use Topics  . Alcohol use: No    FAMILY HISTORY:  No hx CAD HTn  DRUG ALLERGIES:   Allergies  Allergen Reactions  . Meloxicam Swelling    REVIEW OF SYSTEMS:  Unable to obtain patient with dementia  MEDICATIONS AT HOME:   Prior to Admission medications   Medication Sig Start Date End Date Taking? Authorizing Provider  acetaminophen (TYLENOL) 325 MG tablet Take 2 tablets (650 mg total) by mouth every 6 (six) hours as needed for mild pain. 07/07/15  Yes Gouru, Deanna Artis, MD  albuterol (PROVENTIL) (2.5 MG/3ML) 0.083% nebulizer solution Dx J44.1 Patient taking  differently: Take 2.5 mg by nebulization 2 (two) times daily.  03/17/18  Yes Wieting, Richard, MD  allopurinol (ZYLOPRIM) 100 MG tablet Take 100 mg by mouth daily.   Yes [provider]  aspirin 81 MG tablet Take 81 mg by mouth daily.    Yes [provider]  cholecalciferol (VITAMIN D) 1000 units tablet Take 1,000 Units by mouth daily.    Yes [provider]  citalopram (CELEXA) 20 MG tablet Take 20 mg by mouth daily.   Yes [provider]  donepezil (ARICEPT) 5 MG tablet Take 5 mg by mouth at bedtime.   Yes [provider]  furosemide (LASIX) 20 MG tablet Take 20 mg by mouth daily.   Yes [provider]  guaiFENesin-dextromethorphan (ROBITUSSIN DM) 100-10 MG/5ML syrup Take 5 mLs by mouth every 6 (six) hours as needed.   Yes [provider]  levothyroxine (SYNTHROID, LEVOTHROID) 50 MCG tablet Take 50 mcg by mouth every morning.   Yes [provider]  memantine (NAMENDA) 10 MG tablet Take 10 mg by mouth 2 (two) times daily.    Yes [provider]  metFORMIN (GLUCOPHAGE) 500 MG tablet Take 500 mg by mouth daily.   Yes [provider]  potassium chloride (K-DUR) 10 MEQ tablet Take 10 mEq by mouth daily.   Yes [provider]  pravastatin (PRAVACHOL) 40 MG tablet Take 40 mg by mouth daily.   Yes [provider]  Skin Protectants, Misc. (EUCERIN) cream Apply  topically 4 (four) times daily as needed.   Yes [provider]  budesonide (PULMICORT) 0.5 MG/2ML nebulizer solution Dx J44.1 03/17/18   Alford Highland, MD  liver oil-zinc oxide (DESITIN) 40 % ointment Apply topically continuous as needed for irritation. 03/17/18   Alford Highland, MD      VITAL SIGNS:  Blood pressure 120/79, pulse 94, temperature 99.5 F (37.5 C), temperature source Rectal, resp. rate (!) 22, height 5' (1.524 m), weight 76.4 kg, SpO2 95 %.  PHYSICAL EXAMINATION:   Physical Exam Constitutional:      General:  She is not in acute distress.    Appearance: She is well-developed and normal weight. She is not ill-appearing.     Interventions: She is not intubated. HENT:     Head: Normocephalic.  Eyes:     General: No scleral icterus. Neck:     Musculoskeletal: Normal range of motion and neck supple.     Vascular: No JVD.     Trachea: No tracheal deviation.  Cardiovascular:     Rate and Rhythm: Normal rate and regular rhythm.     Heart sounds: Normal heart sounds. No murmur. No friction rub. No gallop.   Pulmonary:     Effort: Pulmonary effort is normal. No bradypnea, accessory muscle usage or respiratory distress. She is not intubated.     Breath sounds: No stridor. Examination of the right-upper field reveals wheezing. Examination of the left-upper field reveals wheezing. Examination of the right-middle field reveals wheezing. Examination of the left-middle field reveals wheezing. Examination of the right-lower field reveals wheezing. Examination of the left-lower field reveals wheezing. Wheezing present. No rales.  Chest:     Chest wall: No tenderness.  Abdominal:     General: Bowel sounds are normal. There is no distension.     Palpations: Abdomen is soft. There is no mass.     Tenderness: There is no abdominal tenderness. There is no guarding or rebound.  Musculoskeletal: Normal range of motion.  Skin:    General: Skin is warm.     Findings: No erythema or rash.  Neurological:     Mental Status: She is alert.     Comments: Oriented to name only  Psychiatric:        Mood and Affect: Mood is not anxious.        Behavior: Behavior is not agitated.        Judgment: Judgment normal.     Comments: pleaseantly demented       LABORATORY PANEL:   CBC Recent Labs  Lab 04/11/18 1020  WBC 6.2  HGB 13.2  HCT 41.9  PLT 196   ------------------------------------------------------------------------------------------------------------------  Chemistries  Recent Labs  Lab  04/11/18 1020  NA 138  K 3.9  CL 101  CO2 28  GLUCOSE 193*  BUN 17  CREATININE 0.84  CALCIUM 8.8*  AST 59*  ALT 56*  ALKPHOS 80  BILITOT 0.7   ------------------------------------------------------------------------------------------------------------------  Cardiac Enzymes No results for input(s): TROPONINI in the last 168 hours. ------------------------------------------------------------------------------------------------------------------  RADIOLOGY:  Dg Chest Portable 1 View  Result Date: 04/11/2018 CLINICAL DATA:  Wheezing, shortness of breath, and low-grade fever. EXAM: PORTABLE CHEST 1 VIEW COMPARISON:  Chest x-ray dated March 16, 2018. FINDINGS: The heart size and mediastinal contours are within normal limits. Normal pulmonary vascularity. Atherosclerotic calcification of the aortic arch. No focal consolidation, pleural effusion, or pneumothorax. Unchanged elevation of the left hemidiaphragm with mild left basilar atelectasis/scarring. No acute osseous abnormality. IMPRESSION: No active disease. Electronically  Signed   By: Obie DredgeWilliam T Derry M.D.   On: 04/11/2018 10:56    EKG:   Normal sinus rhythm without ST elevation or depression  IMPRESSION AND PLAN:   85 year old female with a history of moderate asthma and dementia who presents to the emergency room due to shortness of breath and wheezing.  1.  Acute hypoxic respiratory failure in the setting of acute asthmatic bronchitis with acute exacerbation: Influenza testing is negative Chest x-ray without pneumonia/consolidation Wean oxygen as tolerated Continue Solu-Medrol 40 every 12 Doxycycline for bronchitis Duo nebs  2.  Advanced Dementia with assistance for all ADLs: Continue Aricept and namenda Family requested patient be fed if family is not at bedside.  3.  Diabetes: Sliding scale with ADA diet  4.  Hypothyroidism: Continue Synthroid  5.  Depression: Continue Celexa  6.  Hyperlipidemia: Continue  statin  CSW consultation for discharge planning      All the records are reviewed and case discussed with ED provider. Management plans discussed with the patient's son and he in agreement  CODE STATUS: DNR  TOTAL TIME TAKING CARE OF THIS PATIENT: 44 minutes.    Aigner Horseman M.D on 04/11/2018 at 11:35 AM  Between 7am to 6pm - Pager - [phone]  After 6pm go to [email or web address] - Social research officer, governmentpassword EPAS [facility]  [facility]  Office  [phone]  CC: Primary care physician; [facility]

## 2018-04-11 NOTE — Progress Notes (Signed)
Family Meeting Note  Advance Directive:yes  Today a meeting took place with the Patient.    The following clinical team members were present during this meeting:MD  The following were discussed:Patient's diagnosis: acute hypoxic respiratory failure with asthma exacerbation, Patient's progosis: Unable to determine and Goals for treatment: DNR  Additional follow-up to be provided: dnr  No change in Ad  Time spent during discussion:16 minutes  Stephanie Sherrin, MD

## 2018-04-11 NOTE — Progress Notes (Signed)
PT Cancellation Note  Patient Details Name: Stephanie Duarte MRN: [id number] DOB: November 11, 1928   Cancelled Treatment:    Reason Eval/Treat Not Completed: Other (comment)(Nursing staff at beside feeding pt lunch at this time, PT will follow up to perform evaluation as able.)  Olga Coaster PT, DPT 2:52 PM,04/11/18 [phone]

## 2018-04-11 NOTE — ED Notes (Signed)
RN unable to take report at this time. Will call back to receive report.

## 2018-04-12 LAB — BASIC METABOLIC PANEL
Anion gap: 10 (ref 5–15)
BUN: 23 mg/dL (ref 8–23)
CHLORIDE: 103 mmol/L (ref 98–111)
CO2: 28 mmol/L (ref 22–32)
Calcium: 9.1 mg/dL (ref 8.9–10.3)
Creatinine, Ser: 1.22 mg/dL — ABNORMAL HIGH (ref 0.44–1.00)
GFR calc Af Amer: 46 mL/min — ABNORMAL LOW (ref >60)
GFR calc non Af Amer: 40 mL/min — ABNORMAL LOW (ref >60)
GLUCOSE: 208 mg/dL — AB (ref 70–99)
Potassium: 4.6 mmol/L (ref 3.5–5.1)
Sodium: 141 mmol/L (ref 135–145)

## 2018-04-12 LAB — CBC
HCT: 38.6 % (ref 36.0–46.0)
Hemoglobin: 11.9 g/dL — ABNORMAL LOW (ref 12.0–15.0)
MCH: 29.7 pg (ref 26.0–34.0)
MCHC: 30.8 g/dL (ref 30.0–36.0)
MCV: 96.3 fL (ref 80.0–100.0)
Platelets: 188 10*3/uL (ref 150–400)
RBC: 4.01 MIL/uL (ref 3.87–5.11)
RDW: 14.8 % (ref 11.5–15.5)
WBC: 6.3 10*3/uL (ref 4.0–10.5)
nRBC: 0 % (ref 0.0–0.2)

## 2018-04-12 LAB — GLUCOSE, CAPILLARY
GLUCOSE-CAPILLARY: 171 mg/dL — AB (ref 70–99)
Glucose-Capillary: 190 mg/dL — ABNORMAL HIGH (ref 70–99)
Glucose-Capillary: 166 mg/dL — ABNORMAL HIGH (ref 70–99)
Glucose-Capillary: 227 mg/dL — ABNORMAL HIGH (ref 70–99)

## 2018-04-12 MED ORDER — DOXYCYCLINE CALCIUM 50 MG/5ML PO SYRP
100.0000 mg | ORAL_SOLUTION | Freq: Two times a day (BID) | ORAL | Status: DC
  Administered 2018-04-12: 100 mg via ORAL
  Filled 2018-04-12 (×4): qty 10

## 2018-04-12 NOTE — [place] FL2 (Signed)
[place] MEDICAID FL2 LEVEL OF CARE SCREENING TOOL     IDENTIFICATION  Patient Name: Stephanie Duarte Birthdate: [birth year] Sex: female Admission Date (Current Location): 04/11/2018  [place] and [place] Number:  Chiropodist and Address:  [facility], [address] [place], [place] [address]      Provider Number: [id number]  Attending Physician Name and Address:  Campbell Stall, MD  Relative Name and Phone Number:  Lawerance Cruel (Daughter) [phone] or Clent Demark (Son-in-law) [phone]    Current Level of Care: Hospital Recommended Level of Care: Memory Care Prior Approval Number:    Date Approved/Denied:   PASRR Number:    Discharge Plan: Domiciliary (Rest home)([facility])    Current Diagnoses: Patient Active Problem List   Diagnosis Date Noted  . Asthma exacerbation 04/11/2018  . Pneumonia 03/12/2018  . Fever 07/04/2015    Orientation RESPIRATION BLADDER Height & Weight     Self  O2(1L o2) Incontinent Weight: 186 lb (84.4 kg) Height:  5' (152.4 cm)  BEHAVIORAL SYMPTOMS/MOOD NEUROLOGICAL BOWEL NUTRITION STATUS      Incontinent Diet(Heart healthy/Carb modified)  AMBULATORY STATUS COMMUNICATION OF NEEDS Skin   Total Care Verbally Normal                       Personal Care Assistance Level of Assistance  Bathing, Feeding, Dressing Bathing Assistance: Maximum assistance Feeding assistance: Limited assistance Dressing Assistance: Maximum assistance     Functional Limitations Info  Sight, Hearing, Speech Sight Info: Adequate Hearing Info: Adequate Speech Info: Adequate    SPECIAL CARE FACTORS FREQUENCY                       Contractures Contractures Info: Not present    Additional Factors Info  Code Status, Allergies, Psychotropic Code Status Info: DNR Allergies Info: Meloxicam Psychotropic Info: Celexa         Current Medications (04/12/2018):  This is the current hospital active medication  list Current Facility-Administered Medications  Medication Dose Route Frequency Provider Last Rate Last Dose  . acetaminophen (TYLENOL) tablet 650 mg  650 mg Oral Q6H PRN Adrian Saran, MD       Or  . acetaminophen (TYLENOL) suppository 650 mg  650 mg Rectal Q6H PRN Juliene Pina, Sital, MD      . acetaminophen (TYLENOL) tablet 650 mg  650 mg Oral Q6H PRN Mody, Sital, MD      . allopurinol (ZYLOPRIM) tablet 100 mg  100 mg Oral Daily Mody, Sital, MD      . aspirin chewable tablet 81 mg  81 mg Oral Daily Mody, Sital, MD      . cholecalciferol (VITAMIN D) tablet 1,000 Units  1,000 Units Oral Daily Mody, Sital, MD      . citalopram (CELEXA) tablet 20 mg  20 mg Oral Daily Mody, Sital, MD      . donepezil (ARICEPT) tablet 5 mg  5 mg Oral QHS Adrian Saran, MD   5 mg at 04/11/18 2013  . doxycycline (VIBRA-TABS) tablet 100 mg  100 mg Oral Q12H Mody, Sital, MD   100 mg at 04/11/18 2014  . enoxaparin (LOVENOX) injection 40 mg  40 mg Subcutaneous Q24H Mody, Sital, MD   40 mg at 04/11/18 2013  . furosemide (LASIX) tablet 20 mg  20 mg Oral Daily Mody, Sital, MD      . guaiFENesin-dextromethorphan (ROBITUSSIN DM) 100-10 MG/5ML syrup 5 mL  5 mL Oral Q6H  PRN Adrian Saran, MD   5 mL at 04/11/18 1501  . insulin aspart (novoLOG) injection 0-15 Units  0-15 Units Subcutaneous TID WC Adrian Saran, MD   3 Units at 04/12/18 0814  . ipratropium-albuterol (DUONEB) 0.5-2.5 (3) MG/3ML nebulizer solution 3 mL  3 mL Nebulization Q4H Mody, Sital, MD   3 mL at 04/12/18 1105  . levothyroxine (SYNTHROID, LEVOTHROID) tablet 50 mcg  50 mcg Oral Lyla Son, Patricia Pesa, MD   50 mcg at 04/12/18 0813  . MEDLINE mouth rinse  15 mL Mouth Rinse BID Mody, Sital, MD      . memantine (NAMENDA) tablet 10 mg  10 mg Oral BID Adrian Saran, MD   10 mg at 04/11/18 2014  . methylPREDNISolone sodium succinate (SOLU-MEDROL) 40 mg/mL injection 40 mg  40 mg Intravenous Q12H Adrian Saran, MD   40 mg at 04/12/18 0349  . ondansetron (ZOFRAN) tablet 4 mg  4 mg Oral Q6H PRN  Adrian Saran, MD       Or  . ondansetron (ZOFRAN) injection 4 mg  4 mg Intravenous Q6H PRN Mody, Sital, MD      . polyethylene glycol (MIRALAX / GLYCOLAX) packet 17 g  17 g Oral Daily PRN Mody, Sital, MD      . pravastatin (PRAVACHOL) tablet 40 mg  40 mg Oral Daily Adrian Saran, MD   40 mg at 04/11/18 1802     Discharge Medications: Please see discharge summary for a list of discharge medications.  Relevant Imaging Results:  Relevant Lab Results:   Additional Information SS#[id number]  Judi Cong, LCSW

## 2018-04-12 NOTE — Evaluation (Signed)
Physical Therapy Evaluation Patient Details Name: Stephanie Duarte MRN: [id number] DOB: 10/13/1928 Today's Date: 04/12/2018   History of Present Illness  85 y.o. female with a known history of moderate persistent asthma, Alzheimer's dementia, hypothyroidism and diabetes who presents to the emergency room due to shortness of breath and wheezing.    Clinical Impression  Patient distressed at start of session, asking for help, and reporting back/buttock pain throughout. Patient able to state name, and part of birthday. Pt became significantly agitated and resistive to movement, unable to achieve supine to sit with maxAx2. Further bed mobility performed with modAx1 and max verbal/tactile cues. Per chart review, pt admitted 03/15/2017, and per last PT note, [facility] at baseline. Pt demonstrated significant deficits from PLOF, and would benefit from further skilled PT to address these. Recommendation is STR pending pt's ability to participate with PT and pt functional mobility progress here in hospital.     Follow Up Recommendations SNF    Equipment Recommendations  Other (comment)(TBD)    Recommendations for Other Services       Precautions / Restrictions Precautions Precautions: Fall Restrictions Weight Bearing Restrictions: No      Mobility  Bed Mobility Overal bed mobility: Needs Assistance Bed Mobility: Rolling;Sidelying to Sit;Supine to Sit;Sit to Supine Rolling: Mod assist Sidelying to sit: Mod assist Supine to sit: Max assist;+2 for physical assistance Sit to supine: Max assist;+2 for physical assistance   General bed mobility comments: Pt grew very agitated, yelling, grasping at PT with attempted mobility today. Resistive to movement  Transfers                 General transfer comment: deferred due safety concerns  Ambulation/Gait                Stairs            Wheelchair Mobility    Modified Rankin (Stroke Patients  Only)       Balance Overall balance assessment: Needs assistance   Sitting balance-Leahy Scale: Zero Sitting balance - Comments: Pt very resistive to movements                                     Pertinent Vitals/Pain Pain Assessment: Faces Faces Pain Scale: Hurts even more Pain Location: patient care activities Pain Descriptors / Indicators: Grimacing;Crying;Moaning;Guarding Pain Intervention(s): Limited activity within patient's tolerance;Repositioned;Monitored during session    Home Living Family/patient expects to be discharged to:: Skilled nursing facility                 Additional Comments[facility] provided with assistance for ADLs.     Prior Function Level of Independence: Needs assistance   Gait / Transfers Assistance Needed: Patient is able to transfer to wheelchair only.            Hand Dominance        Extremity/Trunk Assessment   Upper Extremity Assessment Upper Extremity Assessment: Generalized weakness;Difficult to assess due to impaired cognition    Lower Extremity Assessment Lower Extremity Assessment: Generalized weakness;Difficult to assess due to impaired cognition       Communication      Cognition Arousal/Alertness: Awake/alert Behavior During Therapy: Agitated;Anxious Overall Cognitive Status: No family/caregiver present to determine baseline cognitive functioning  General Comments      Exercises     Assessment/Plan    PT Assessment Patient needs continued PT services  PT Problem List Decreased strength;Decreased activity tolerance;Decreased balance;Decreased safety awareness;Decreased mobility;Decreased knowledge of precautions       PT Treatment Interventions DME instruction;Therapeutic exercise;Balance training;Gait training;Stair training;Neuromuscular re-education;Functional mobility training;Therapeutic  activities;Patient/family education    PT Goals (Current goals can be found in the Care Plan section)  Acute Rehab PT Goals PT Goal Formulation: Patient unable to participate in goal setting    Frequency Min 2X/week   Barriers to discharge        Co-evaluation               AM-PAC PT "6 Clicks" Mobility  Outcome Measure Help needed turning from your back to your side while in a flat bed without using bedrails?: A Lot Help needed moving from lying on your back to sitting on the side of a flat bed without using bedrails?: A Lot Help needed moving to and from a bed to a chair (including a wheelchair)?: Total Help needed standing up from a chair using your arms (e.g., wheelchair or bedside chair)?: Total Help needed to walk in hospital room?: Total Help needed climbing 3-5 steps with a railing? : Total 6 Click Score: 8    End of Session Equipment Utilized During Treatment: Oxygen Activity Tolerance: Treatment limited secondary to agitation Patient left: in bed;with bed alarm set;with nursing/sitter in room Nurse Communication: Mobility status PT Visit Diagnosis: Muscle weakness (generalized) (M62.81);Difficulty in walking, not elsewhere classified (R26.2);Other abnormalities of gait and mobility (R26.89)    Time: 0933-1000 PT Time Calculation (min) (ACUTE ONLY): 27 min   Charges:   PT Evaluation $PT Eval Moderate Complexity: 1 Mod PT Treatments $Therapeutic Activity: 8-22 mins       Olga Coaster PT, DPT 12:00 PM,04/12/18 [phone]

## 2018-04-12 NOTE — Progress Notes (Signed)
[facility] [place] at [facility]   PATIENT NAME: Blonnie Constantinides    MR#:  [id number]  DATE OF BIRTH:  [birth year]  SUBJECTIVE:   Patient states she is feeling better this morning.  Her shortness of breath is a little bit better than yesterday.  She is still not feeling back to her baseline.  She denies any chest pain.  REVIEW OF SYSTEMS:  Review of Systems  Constitutional: Negative for chills and fever.  HENT: Negative for congestion and sore throat.   Eyes: Negative for blurred vision and double vision.  Respiratory: Positive for shortness of breath. Negative for cough.   Cardiovascular: Negative for chest pain and leg swelling.  Gastrointestinal: Negative for nausea and vomiting.  Genitourinary: Negative for dysuria and urgency.  Musculoskeletal: Negative for back pain and neck pain.  Neurological: Negative for dizziness and headaches.  Psychiatric/Behavioral: Negative for depression. The patient is not nervous/anxious.     DRUG ALLERGIES:   Allergies  Allergen Reactions  . Meloxicam Swelling   VITALS:  Blood pressure (!) 81/67, pulse 64, temperature 97.8 F (36.6 C), temperature source Oral, resp. rate (!) 22, height 5' (1.524 m), weight 84.4 kg, SpO2 93 %. PHYSICAL EXAMINATION:  Physical Exam  Constitutional:   Sitting up in bed in no acute distress HEENT: Cephalic, atraumatic, EOMI, no scleral icterus, moist mucous membranes Neck: Full range of motion, supple, no JVD Cardiovascular: RRR, no murmurs, rubs, gallops Pulmonary: Normal work of breathing, + diffuse inspiratory and expiratory wheezing throughout all lung fields, nasal cannula in place Abdominal: + Bowel sounds, soft, nontender, nondistended Musculoskeletal:  No edema, cyanosis, clubbing Skin: Skin is warm, no rashes or lesions. Neurological: Cranial nerves II through XII grossly intact, no focal deficits, + global weakness, sensation intact throughout Psychiatric: Alert and oriented x 1,  normal affect LABORATORY PANEL:  Female CBC Recent Labs  Lab 04/12/18 0404  WBC 6.3  HGB 11.9*  HCT 38.6  PLT 188   ------------------------------------------------------------------------------------------------------------------ Chemistries  Recent Labs  Lab 04/11/18 1020 04/12/18 0404  NA 138 141  K 3.9 4.6  CL 101 103  CO2 28 28  GLUCOSE 193* 208*  BUN 17 23  CREATININE 0.84 1.22*  CALCIUM 8.8* 9.1  AST 59*  --   ALT 56*  --   ALKPHOS 80  --   BILITOT 0.7  --    RADIOLOGY:  No results found. ASSESSMENT AND PLAN:   1. Acute hypoxic respiratory failure in the setting of acute asthmatic bronchitis with acute exacerbation- Chest x-ray negative.  Patient remains on 1 L O2 today.  Still with diffuse inspiratory and expiratory wheezing. -Wean oxygen as tolerated -Continue Solumedrol for today -Continue doxycycline -Duonebs PRN  2.  AKI- creatinine increased from 0.84 to 1.22. -Hold Lasix for now -Avoid nephrotoxic agents -Repeat BMP in the morning  3. Advanced Dementia with assistance for all ADLs: Continue Aricept and namenda -Provide assistance with feeds -Plan for likely discharge back to memory care unit with home health -PT eval pending  4. Type II diabetes: Continue sliding scale  5. Hypothyroidism: Continue Synthroid  6. Depression: Continue Celexa  7. Hyperlipidemia: Continue statin  All the records are reviewed and case discussed with Care Management/Social Worker. Management plans discussed with the patient, family and they are in agreement.  CODE STATUS: DNR  TOTAL TIME TAKING CARE OF THIS PATIENT: 40 minutes.   More than 50% of the time was spent in counseling/coordination of care: YES  POSSIBLE D/C tomorrow,  DEPENDING ON CLINICAL CONDITION.   Jinny BlossomKaty D Ravina Milner M.D on 04/12/2018 at 11:56 AM  Between 7am to 6pm - Pager - [phone]  After 6pm go to [email or web address] - Social research officer, governmentpassword [email or web address] [email or web address]  [facility]  Office   [phone]  CC: Primary care physician; [facility], [employer]  Note: This dictation was prepared with Dragon dictation along with smaller phrase technology. Any transcriptional errors that result from this process are unintentional.

## 2018-04-12 NOTE — Clinical Social Work Note (Signed)
Clinical Social Work Assessment  Patient Details  Name: Stephanie Duarte MRN: [id number] Date of Birth: Oct 10, 1928  Date of referral:  04/12/18               Reason for consult:  Facility Placement                Permission sought to share information with:  Oceanographer granted to share information::  Yes, Verbal Permission Granted  Name::        Agency::  [facility]  Relationship::     Contact Information:     Housing/Transportation Living arrangements for the past 2 months:  Assisted Dealer of Information:  Medical Team, Adult Children, Facility Patient Interpreter Needed:  None Criminal Activity/Legal Involvement Pertinent to Current Situation/Hospitalization:  No - Comment as needed Significant Relationships:  Adult Children, Merchandiser, retail Lives with:  Facility Resident Do you feel safe going back to the place where you live?  Yes Need for family participation in patient care:  Yes (Comment)(Patient has advanced dementia. )  Care giving concerns:  Patient admitted from [facility]   Social Worker assessment / plan: The CSW attempted to meet with the patient and family at bedside. The patient was sleeping, and no family was available. The CSW contacted the patient's daughter by phone to discuss discharge planning. The patient's daughter shared that her mother was recently at [facility] but was unable to make progress with rehab. The family would prefer that the patient return to [facility] rather than attempt SNF placement should PT recommend such due to the barrier of memory care needs.  The CSW attempted to contact an administrator at [facility] with no success to confirm that patient can return when stable. The CSW will attempt during regular business hours. The patient may discharge tomorrow or Tuesday pending stability. CSW is following.  Employment status:  Disabled  (Comment on whether or not currently receiving Disability), Retired Database administrator PT Recommendations:  Not assessed at this time Information / Referral to community resources:     Patient/Family's Response to care:  The patient's daughter thanked the CSW.  Patient/Family's Understanding of and Emotional Response to Diagnosis, Current Treatment, and Prognosis:  The patient's family seems to understand the barriers to rehab and are in agreement with return to [facility] when stable.  Emotional Assessment Appearance:  Appears stated age Attitude/Demeanor/Rapport:  Lethargic Affect (typically observed):  Stable Orientation:  Oriented to Self Alcohol / Substance use:  Never Used Psych involvement (Current and /or in the community):  No (Comment)  Discharge Needs  Concerns to be addressed:  Discharge Planning Concerns, Care Coordination Readmission within the last 30 days:  Yes Current discharge risk:  Chronically ill, Cognitively Impaired, Physical Impairment Barriers to Discharge:  Continued Medical Work up   [employer], LCSW 04/12/2018, 11:52 AM

## 2018-04-13 LAB — BASIC METABOLIC PANEL
Anion gap: 6 (ref 5–15)
BUN: 36 mg/dL — ABNORMAL HIGH (ref 8–23)
CO2: 29 mmol/L (ref 22–32)
Calcium: 8.8 mg/dL — ABNORMAL LOW (ref 8.9–10.3)
Chloride: 104 mmol/L (ref 98–111)
Creatinine, Ser: 1.06 mg/dL — ABNORMAL HIGH (ref 0.44–1.00)
GFR calc Af Amer: 54 mL/min — ABNORMAL LOW (ref >60)
GFR calc non Af Amer: 47 mL/min — ABNORMAL LOW (ref >60)
Glucose, Bld: 175 mg/dL — ABNORMAL HIGH (ref 70–99)
Potassium: 4.4 mmol/L (ref 3.5–5.1)
Sodium: 139 mmol/L (ref 135–145)

## 2018-04-13 LAB — GLUCOSE, CAPILLARY
Glucose-Capillary: 130 mg/dL — ABNORMAL HIGH (ref 70–99)
Glucose-Capillary: 123 mg/dL — ABNORMAL HIGH (ref 70–99)
Glucose-Capillary: 197 mg/dL — ABNORMAL HIGH (ref 70–99)

## 2018-04-13 MED ORDER — PREDNISONE 50 MG PO TABS: 50.0000 mg | ORAL_TABLET | Freq: Every day | ORAL | 0 refills | Status: DC

## 2018-04-13 MED ORDER — DOXYCYCLINE CALCIUM 50 MG/5ML PO SYRP: 100.0000 mg | ORAL_SOLUTION | Freq: Two times a day (BID) | ORAL | 0 refills | Status: DC

## 2018-04-13 MED ORDER — IPRATROPIUM-ALBUTEROL 0.5-2.5 (3) MG/3ML IN SOLN: 3.0000 mL | Freq: Four times a day (QID) | RESPIRATORY_TRACT | Status: DC | PRN

## 2018-04-13 MED ORDER — IPRATROPIUM-ALBUTEROL 0.5-2.5 (3) MG/3ML IN SOLN: 3.0000 mL | Freq: Two times a day (BID) | RESPIRATORY_TRACT | Status: DC

## 2018-04-13 NOTE — Progress Notes (Signed)
Clinical Child psychotherapistocial Worker (CSW) left voicemails for [facility]ales [facility] care coordinators to see if patient can [facility] as per family's request. CSW is waiting on a return call from [facility].   [employer]Bailey Bridget [place], LCSW [phone]

## 2018-04-13 NOTE — Progress Notes (Signed)
Patient is discharged and returning to [facility].  Report called to Margrett Rud, RN

## 2018-04-13 NOTE — Care Management (Signed)
[facility] fax number is [phone] Family would like to use [facility].  Will fax orders

## 2018-04-13 NOTE — [place] FL2 (Signed)
[place] MEDICAID FL2 LEVEL OF CARE SCREENING TOOL     IDENTIFICATION  Patient Name: Stephanie Duarte Birthdate: 02/23/1929 Sex: female Admission Date (Current Location): 04/11/2018  [place]   Facility and Address:  [facility], [address], [place], [place] [address]      Provider Number: [id number]  Attending Physician Name and Address:  Campbell StallMayo, Katy Dodd, MD  Relative Name and Phone Number:  Lawerance CruelMitzi Ingle (Daughter) [phone] or Clent DemarkJames Ingle (Son-in-law) [phone]    Current Level of Care: Hospital Recommended Level of Care: Assisted Living Facility  Memory Care Prior Approval Number:    Date Approved/Denied:   PASRR Number:  [id number] [id number]  Discharge Plan: Domiciliary (Rest home)([facility])    Current Diagnoses: Primary: Dementia  Patient Active Problem List   Diagnosis Date Noted  . Asthma exacerbation 04/11/2018  . Pneumonia 03/12/2018  . Fever 07/04/2015    Orientation RESPIRATION BLADDER Height & Weight     Self  Room Air  Incontinent Weight: 186 lb (84.4 kg) Height:  5' (152.4 cm)  BEHAVIORAL SYMPTOMS/MOOD NEUROLOGICAL BOWEL NUTRITION STATUS      Incontinent Diet(Heart healthy/Carb modified)  AMBULATORY STATUS COMMUNICATION OF NEEDS Skin   Maximum assistance  Verbally Normal                       Personal Care Assistance Level of Assistance  Bathing, Feeding, Dressing Bathing Assistance: Maximum assistance Feeding assistance: Limited assistance Dressing Assistance: Maximum assistance     Functional Limitations Info  Sight, Hearing, Speech Sight Info: Adequate Hearing Info: Adequate Speech Info: Adequate    SPECIAL CARE FACTORS FREQUENCY   PT home health 2-3 days per week                     Contractures Contractures Info: Not present    Additional Factors Info  Code Status, Allergies, Psychotropic Code Status Info: DNR Allergies Info: Meloxicam Psychotropic Info:  Celexa        Discharge Medications: Please see discharge summary for a list of discharge medications. STOP taking these medications   liver oil-zinc oxide 40 % ointment Commonly known as:  DESITIN     TAKE these medications   acetaminophen 325 MG tablet Commonly known as:  TYLENOL Take 2 tablets (650 mg total) by mouth every 6 (six) hours as needed for mild pain.   albuterol (2.5 MG/3ML) 0.083% nebulizer solution Commonly known as:  PROVENTIL Dx J44.1 What changed:    how much to take  how to take this  when to take this  additional instructions   allopurinol 100 MG tablet Commonly known as:  ZYLOPRIM Take 100 mg by mouth daily.   aspirin 81 MG tablet Take 81 mg by mouth daily.   budesonide 0.5 MG/2ML nebulizer solution Commonly known as:  PULMICORT Dx J44.1   cholecalciferol 1000 units tablet Commonly known as:  VITAMIN D Take 1,000 Units by mouth daily.   citalopram 20 MG tablet Commonly known as:  CELEXA Take 20 mg by mouth daily.   donepezil 5 MG tablet Commonly known as:  ARICEPT Take 5 mg by mouth at bedtime.   doxycycline 50 MG/5ML Syrp Commonly known as:  VIBRAMYCIN Take 10 mLs (100 mg total) by mouth 2 (two) times daily.   eucerin cream Apply topically 4 (four) times daily as needed.   furosemide 20 MG tablet Commonly known as:  LASIX Take 20 mg by mouth daily.  guaiFENesin-dextromethorphan 100-10 MG/5ML syrup Commonly known as:  ROBITUSSIN DM Take 5 mLs by mouth every 6 (six) hours as needed.   levothyroxine 50 MCG tablet Commonly known as:  SYNTHROID, LEVOTHROID Take 50 mcg by mouth every morning.   memantine 10 MG tablet Commonly known as:  NAMENDA Take 10 mg by mouth 2 (two) times daily.   metFORMIN 500 MG tablet Commonly known as:  GLUCOPHAGE Take 500 mg by mouth daily.   potassium chloride 10 MEQ tablet Commonly known as:  K-DUR Take 10 mEq by mouth daily.   pravastatin 40 MG tablet Commonly known  as:  PRAVACHOL Take 40 mg by mouth daily.   predniSONE 50 MG tablet Commonly known as:  DELTASONE Take 1 tablet (50 mg total) by mouth daily with breakfast. Start taking on:  April 14, 2018     Relevant Imaging Results: Relevant Lab Results: Additional Information SS#[id number]  Reet Scharrer, Darleen Crocker, [place]

## 2018-04-13 NOTE — Care Management (Signed)
refaxed [facility] PT orders to [phone]

## 2018-04-13 NOTE — Plan of Care (Signed)

## 2018-04-13 NOTE — Care Management (Signed)
Called [facility] to get [facility] set up in the memory care area  Olegario Messier stated that they use [facility] Family can choose to use any company to come in for [facility] Call transferred to [place]  in Memory care, she dropped the call when asked for a fax number Fax order to  Memory care, no fax number given

## 2018-04-13 NOTE — Progress Notes (Signed)
Patient is medically stable for D/C back to [facility] today. Clinical Child psychotherapist (CSW) contacted [facility] and spoke to Transport planner [place]. CSW made [place] aware that PT is recommending SNF however patient's daughter Marin Comment wants patient to return to the familiar environment of the memory care unit. Per [place] patient can return today via EMS. Per [place] patient is wheel chair bound at baseline, on room air at baseline and is not on insulin at baseline. RN will call report and arrange EMS for transport. CSW sent D/C summary and FL2 to [facility]. RN case manager arranged home health. Patient's daughter Marin Comment is agreeable to D/C plan and is okay with [facility] home health. CSW made daughter aware that [facility] is the facility's preferred home health agency and daughter chose [facility] home health. Please reconsult if future social work needs arise. CSW signing off.   [employer], LCSW [phone]

## 2018-04-13 NOTE — Care Management (Signed)
Faxed HH orders to [facility] at [phone]

## 2018-04-13 NOTE — Discharge Summary (Signed)
[facility] [place] at [facility]   PATIENT NAME: Stephanie Duarte    MR#:  [id number]  DATE OF BIRTH:  10/31/1928  DATE OF ADMISSION:  04/11/2018   ADMITTING PHYSICIAN: Adrian SaranSital Mody, MD  DATE OF DISCHARGE: 04/13/18  PRIMARY CARE PHYSICIAN: [facility]   ADMISSION DIAGNOSIS:  Acute respiratory failure with hypoxia (HCC) [J96.01] DISCHARGE DIAGNOSIS:  Active Problems:   Asthma exacerbation  SECONDARY DIAGNOSIS:   Past Medical History:  Diagnosis Date  . Asthma   . Dementia (HCC)   . Hyperlipidemia   . Hypertension    HOSPITAL COURSE:   Stephanie Duarte is an 85 year old female presented to the ED with shortness of breath.  She was requiring supplemental oxygen due to hypoxia.  Chest x-ray did not show pneumonia.  She was admitted for further management.  1. Acute hypoxic respiratory failure in the setting of acute asthmatic bronchitis with acute exacerbation- improved -CXR negative for pneumonia -Required supplemental oxygen initially, but was stable on room air on the day of discharge -Treated with Solu-Medrol and transition to prednisone for a total 5-day course -Treated with doxycycline for a total 5-day course  2.  AKI- resolved -Lasix held for 1 day and then was restarted on discharge  3. AdvancedDementiawith assistance for all ADLs: Continue Aricept andnamenda -Discharge back to memory care unit  4. Type II diabetes:   -Treated with sliding scale while hospitalized -Home oral meds restarted on discharge  5. Hypothyroidism: Continued Synthroid  6. Depression: Continued Celexa  7. Hyperlipidemia: Continued statin  DISCHARGE CONDITIONS:  Asthma Advanced dementia Type 2 diabetes Hypothyroidism Depression Hyperlipidemia CONSULTS OBTAINED:  None DRUG ALLERGIES:   Allergies  Allergen Reactions  . Meloxicam Swelling   DISCHARGE MEDICATIONS:   Allergies as of 04/13/2018      Reactions   Meloxicam Swelling      Medication List     STOP taking these medications   liver oil-zinc oxide 40 % ointment Commonly known as:  DESITIN     TAKE these medications   acetaminophen 325 MG tablet Commonly known as:  TYLENOL Take 2 tablets (650 mg total) by mouth every 6 (six) hours as needed for mild pain.   albuterol (2.5 MG/3ML) 0.083% nebulizer solution Commonly known as:  PROVENTIL Dx J44.1 What changed:    how much to take  how to take this  when to take this  additional instructions   allopurinol 100 MG tablet Commonly known as:  ZYLOPRIM Take 100 mg by mouth daily.   aspirin 81 MG tablet Take 81 mg by mouth daily.   budesonide 0.5 MG/2ML nebulizer solution Commonly known as:  PULMICORT Dx J44.1   cholecalciferol 1000 units tablet Commonly known as:  VITAMIN D Take 1,000 Units by mouth daily.   citalopram 20 MG tablet Commonly known as:  CELEXA Take 20 mg by mouth daily.   donepezil 5 MG tablet Commonly known as:  ARICEPT Take 5 mg by mouth at bedtime.   doxycycline 50 MG/5ML Syrp Commonly known as:  VIBRAMYCIN Take 10 mLs (100 mg total) by mouth 2 (two) times daily.   eucerin cream Apply topically 4 (four) times daily as needed.   furosemide 20 MG tablet Commonly known as:  LASIX Take 20 mg by mouth daily.   guaiFENesin-dextromethorphan 100-10 MG/5ML syrup Commonly known as:  ROBITUSSIN DM Take 5 mLs by mouth every 6 (six) hours as needed.   levothyroxine 50 MCG tablet Commonly known as:  SYNTHROID, LEVOTHROID Take 50 mcg by  mouth every morning.   memantine 10 MG tablet Commonly known as:  NAMENDA Take 10 mg by mouth 2 (two) times daily.   metFORMIN 500 MG tablet Commonly known as:  GLUCOPHAGE Take 500 mg by mouth daily.   potassium chloride 10 MEQ tablet Commonly known as:  K-DUR Take 10 mEq by mouth daily.   pravastatin 40 MG tablet Commonly known as:  PRAVACHOL Take 40 mg by mouth daily.   predniSONE 50 MG tablet Commonly known as:  DELTASONE Take 1 tablet (50 mg  total) by mouth daily with breakfast. Start taking on:  April 14, 2018        DISCHARGE INSTRUCTIONS:  1.  Follow-up with PCP in 5 days 2.  Continue prednisone for a total 5-day course 3.  Continue doxycycline for a total 5-day course DIET:  Cardiac diet and Diabetic diet DISCHARGE CONDITION:  Good ACTIVITY:  Activity as tolerated OXYGEN:  Home Oxygen: No.  Oxygen Delivery: room air DISCHARGE LOCATION:  nursing home   If you experience worsening of your admission symptoms, develop shortness of breath, life threatening emergency, suicidal or homicidal thoughts you must seek medical attention immediately by calling 911 or calling your MD immediately  if symptoms less severe.  You Must read complete instructions/literature along with all the possible adverse reactions/side effects for all the Medicines you take and that have been prescribed to you. Take any new Medicines after you have completely understood and accpet all the possible adverse reactions/side effects.   Please note  You were cared for by a hospitalist during your hospital stay. If you have any questions about your discharge medications or the care you received while you were in the hospital after you are discharged, you can call the unit and asked to speak with the hospitalist on call if the hospitalist that took care of you is not available. Once you are discharged, your primary care physician will handle any further medical issues. Please note that NO REFILLS for any discharge medications will be authorized once you are discharged, as it is imperative that you return to your primary care physician (or establish a relationship with a primary care physician if you do not have one) for your aftercare needs so that they can reassess your need for medications and monitor your lab values.    On the day of Discharge:  VITAL SIGNS:  Blood pressure (!) 112/98, pulse 62, temperature 97.8 F (36.6 C), temperature source Oral,  resp. rate 18, height 5' (1.524 m), weight 84.4 kg, SpO2 93 %. PHYSICAL EXAMINATION:  GENERAL:  85 y.o.-year-old patient lying in the bed with no acute distress.  EYES: Pupils equal, round, reactive to light and accommodation. No scleral icterus. Extraocular muscles intact.  HEENT: Head atraumatic, normocephalic. Oropharynx and nasopharynx clear.  NECK:  Supple, no jugular venous distention. No thyroid enlargement, no tenderness.  LUNGS: + Mild diffuse expiratory wheezing throughout all lung fields. No use of accessory muscles of respiration.  CARDIOVASCULAR: S1, S2 normal. No murmurs, rubs, or gallops.  ABDOMEN: Soft, non-tender, non-distended. Bowel sounds present. No organomegaly or mass.  EXTREMITIES: No pedal edema, cyanosis, or clubbing.  NEUROLOGIC: Cranial nerves II through XII are intact. +global weakness. Sensation intact. Gait not checked.  PSYCHIATRIC: The patient is alert and oriented x 1. SKIN: No obvious rash, lesion, or ulcer.  DATA REVIEW:   CBC Recent Labs  Lab 04/12/18 0404  WBC 6.3  HGB 11.9*  HCT 38.6  PLT 188    Chemistries  Recent Labs  Lab 04/11/18 1020  04/13/18 0533  NA 138   < > 139  K 3.9   < > 4.4  CL 101   < > 104  CO2 28   < > 29  GLUCOSE 193*   < > 175*  BUN 17   < > 36*  CREATININE 0.84   < > 1.06*  CALCIUM 8.8*   < > 8.8*  AST 59*  --   --   ALT 56*  --   --   ALKPHOS 80  --   --   BILITOT 0.7  --   --    < > = values in this interval not displayed.     Microbiology Results  Results for orders placed or performed during the hospital encounter of 04/11/18  MRSA PCR Screening     Status: None   Collection Time: 04/11/18  2:13 PM  Result Value Ref Range Status   MRSA by PCR NEGATIVE NEGATIVE Final    Comment:        The GeneXpert MRSA Assay (FDA approved for NASAL specimens only), is one component of a comprehensive MRSA colonization surveillance program. It is not intended to diagnose MRSA infection nor to guide or monitor  treatment for MRSA infections. Performed at [facility], [address] [place], [place] [address]     RADIOLOGY:  No results found.   Management plans discussed with the patient, family and they are in agreement.  CODE STATUS: DNR   TOTAL TIME TAKING CARE OF THIS PATIENT: 35 minutes.    Jinny BlossomKaty D Tylene Quashie M.D on 04/13/2018 at 12:32 PM  Between 7am to 6pm - Pager [phone]- [phone]  After 6pm go to [email or web address] - Social research officer, governmentpassword EPAS [facility]  [facility] [place] [facility]  Office  [phone]  CC: Primary care physician; [facility]   Note: This dictation was prepared with Dragon dictation along with smaller phrase technology. Any transcriptional errors that result from this process are unintentional.

## 2018-04-13 NOTE — Discharge Instructions (Signed)
It was so nice to meet you during this hospitalization!  You came into the hospital with shortness of breath. We think you had a worsening of your asthma. We gave you some steroids through your IV to help with that.  1. Please take Prednisone 50mg  daily for 2 more days starting tomorrow. 2. Please take Doxycycline tonight and then for 2 more days.  Take care, Dr. Nancy Marus

## 2018-04-17 DIAGNOSIS — J45909 Unspecified asthma, uncomplicated: Secondary | ICD-10-CM | POA: Diagnosis not present

## 2018-05-12 DIAGNOSIS — Z9181 History of falling: Secondary | ICD-10-CM | POA: Diagnosis not present

## 2018-05-12 DIAGNOSIS — J4520 Mild intermittent asthma, uncomplicated: Secondary | ICD-10-CM | POA: Diagnosis not present

## 2018-05-12 DIAGNOSIS — E042 Nontoxic multinodular goiter: Secondary | ICD-10-CM | POA: Diagnosis not present

## 2018-05-12 DIAGNOSIS — F0280 Dementia in other diseases classified elsewhere without behavioral disturbance: Secondary | ICD-10-CM | POA: Diagnosis not present

## 2018-05-12 DIAGNOSIS — G309 Alzheimer's disease, unspecified: Secondary | ICD-10-CM | POA: Diagnosis not present

## 2018-05-18 DIAGNOSIS — M79641 Pain in right hand: Secondary | ICD-10-CM | POA: Diagnosis not present

## 2018-05-18 DIAGNOSIS — J40 Bronchitis, not specified as acute or chronic: Secondary | ICD-10-CM | POA: Diagnosis not present

## 2018-05-18 DIAGNOSIS — M1811 Unilateral primary osteoarthritis of first carpometacarpal joint, right hand: Secondary | ICD-10-CM | POA: Diagnosis not present

## 2018-05-18 DIAGNOSIS — G309 Alzheimer's disease, unspecified: Secondary | ICD-10-CM | POA: Diagnosis not present

## 2018-05-18 DIAGNOSIS — E119 Type 2 diabetes mellitus without complications: Secondary | ICD-10-CM | POA: Diagnosis not present

## 2018-05-20 DIAGNOSIS — E119 Type 2 diabetes mellitus without complications: Secondary | ICD-10-CM | POA: Diagnosis not present

## 2018-05-20 DIAGNOSIS — E042 Nontoxic multinodular goiter: Secondary | ICD-10-CM | POA: Diagnosis not present

## 2019-01-10 DIAGNOSIS — E1142 Type 2 diabetes mellitus with diabetic polyneuropathy: Secondary | ICD-10-CM | POA: Diagnosis not present

## 2019-01-10 DIAGNOSIS — M21611 Bunion of right foot: Secondary | ICD-10-CM | POA: Diagnosis not present

## 2019-01-10 DIAGNOSIS — M21612 Bunion of left foot: Secondary | ICD-10-CM | POA: Diagnosis not present

## 2019-01-10 DIAGNOSIS — B351 Tinea unguium: Secondary | ICD-10-CM | POA: Diagnosis not present

## 2019-03-27 ENCOUNTER — Emergency Department: Payer: Medicare Other

## 2019-03-27 ENCOUNTER — Other Ambulatory Visit: Payer: Self-pay

## 2019-03-27 ENCOUNTER — Inpatient Hospital Stay
Admission: EM | Admit: 2019-03-27 | Discharge: 2019-03-31 | DRG: 177 | Disposition: A | Discharge status: Hospice | Payer: Medicare Other | Source: Skilled Nursing Facility | Attending: Internal Medicine | Admitting: Internal Medicine

## 2019-03-27 ENCOUNTER — Encounter: Payer: Self-pay | Admitting: Emergency Medicine

## 2019-03-27 ENCOUNTER — Inpatient Hospital Stay: Payer: Medicare Other

## 2019-03-27 DIAGNOSIS — I2699 Other pulmonary embolism without acute cor pulmonale: Secondary | ICD-10-CM | POA: Diagnosis present

## 2019-03-27 DIAGNOSIS — I700 Atherosclerosis of aorta: Secondary | ICD-10-CM | POA: Diagnosis present

## 2019-03-27 DIAGNOSIS — I4510 Unspecified right bundle-branch block: Secondary | ICD-10-CM | POA: Diagnosis present

## 2019-03-27 DIAGNOSIS — F329 Major depressive disorder, single episode, unspecified: Secondary | ICD-10-CM | POA: Diagnosis present

## 2019-03-27 DIAGNOSIS — F419 Anxiety disorder, unspecified: Secondary | ICD-10-CM | POA: Diagnosis present

## 2019-03-27 DIAGNOSIS — E039 Hypothyroidism, unspecified: Secondary | ICD-10-CM | POA: Diagnosis present

## 2019-03-27 DIAGNOSIS — Z79890 Hormone replacement therapy (postmenopausal): Secondary | ICD-10-CM

## 2019-03-27 DIAGNOSIS — M109 Gout, unspecified: Secondary | ICD-10-CM | POA: Diagnosis present

## 2019-03-27 DIAGNOSIS — E1122 Type 2 diabetes mellitus with diabetic chronic kidney disease: Secondary | ICD-10-CM | POA: Diagnosis present

## 2019-03-27 DIAGNOSIS — G309 Alzheimer's disease, unspecified: Secondary | ICD-10-CM | POA: Diagnosis present

## 2019-03-27 DIAGNOSIS — R1310 Dysphagia, unspecified: Secondary | ICD-10-CM | POA: Diagnosis present

## 2019-03-27 DIAGNOSIS — N1832 Chronic kidney disease, stage 3b: Secondary | ICD-10-CM | POA: Diagnosis present

## 2019-03-27 DIAGNOSIS — I129 Hypertensive chronic kidney disease with stage 1 through stage 4 chronic kidney disease, or unspecified chronic kidney disease: Secondary | ICD-10-CM | POA: Diagnosis present

## 2019-03-27 DIAGNOSIS — J9601 Acute respiratory failure with hypoxia: Secondary | ICD-10-CM

## 2019-03-27 DIAGNOSIS — J1282 Pneumonia due to coronavirus disease 2019: Secondary | ICD-10-CM | POA: Diagnosis present

## 2019-03-27 DIAGNOSIS — F05 Delirium due to known physiological condition: Secondary | ICD-10-CM | POA: Diagnosis not present

## 2019-03-27 DIAGNOSIS — U071 COVID-19: Secondary | ICD-10-CM | POA: Diagnosis present

## 2019-03-27 DIAGNOSIS — R0902 Hypoxemia: Secondary | ICD-10-CM | POA: Diagnosis present

## 2019-03-27 DIAGNOSIS — F0390 Unspecified dementia without behavioral disturbance: Secondary | ICD-10-CM | POA: Diagnosis not present

## 2019-03-27 DIAGNOSIS — Z515 Encounter for palliative care: Secondary | ICD-10-CM | POA: Diagnosis not present

## 2019-03-27 DIAGNOSIS — J45901 Unspecified asthma with (acute) exacerbation: Secondary | ICD-10-CM | POA: Diagnosis present

## 2019-03-27 DIAGNOSIS — F0280 Dementia in other diseases classified elsewhere without behavioral disturbance: Secondary | ICD-10-CM | POA: Diagnosis present

## 2019-03-27 DIAGNOSIS — Z7952 Long term (current) use of systemic steroids: Secondary | ICD-10-CM

## 2019-03-27 DIAGNOSIS — Z993 Dependence on wheelchair: Secondary | ICD-10-CM

## 2019-03-27 DIAGNOSIS — K219 Gastro-esophageal reflux disease without esophagitis: Secondary | ICD-10-CM | POA: Diagnosis present

## 2019-03-27 DIAGNOSIS — G9341 Metabolic encephalopathy: Secondary | ICD-10-CM | POA: Diagnosis present

## 2019-03-27 DIAGNOSIS — E785 Hyperlipidemia, unspecified: Secondary | ICD-10-CM | POA: Diagnosis not present

## 2019-03-27 DIAGNOSIS — Z792 Long term (current) use of antibiotics: Secondary | ICD-10-CM

## 2019-03-27 DIAGNOSIS — Z66 Do not resuscitate: Secondary | ICD-10-CM | POA: Diagnosis present

## 2019-03-27 DIAGNOSIS — Z7982 Long term (current) use of aspirin: Secondary | ICD-10-CM

## 2019-03-27 DIAGNOSIS — Z7951 Long term (current) use of inhaled steroids: Secondary | ICD-10-CM

## 2019-03-27 DIAGNOSIS — Z79899 Other long term (current) drug therapy: Secondary | ICD-10-CM

## 2019-03-27 DIAGNOSIS — Z7984 Long term (current) use of oral hypoglycemic drugs: Secondary | ICD-10-CM

## 2019-03-27 DIAGNOSIS — Z886 Allergy status to analgesic agent status: Secondary | ICD-10-CM

## 2019-03-27 LAB — COMPREHENSIVE METABOLIC PANEL
ALT: 41 U/L (ref 0–44)
AST: 36 U/L (ref 15–41)
Albumin: 3.7 g/dL (ref 3.5–5.0)
Alkaline Phosphatase: 78 U/L (ref 38–126)
Anion gap: 10 (ref 5–15)
BUN: 22 mg/dL (ref 8–23)
CO2: 30 mmol/L (ref 22–32)
Calcium: 9 mg/dL (ref 8.9–10.3)
Chloride: 98 mmol/L (ref 98–111)
Creatinine, Ser: 1.15 mg/dL — ABNORMAL HIGH (ref 0.44–1.00)
GFR calc Af Amer: 49 mL/min — ABNORMAL LOW (ref >60)
GFR calc non Af Amer: 42 mL/min — ABNORMAL LOW (ref >60)
Glucose, Bld: 124 mg/dL — ABNORMAL HIGH (ref 70–99)
Potassium: 4.6 mmol/L (ref 3.5–5.1)
Sodium: 138 mmol/L (ref 135–145)
Total Bilirubin: 0.7 mg/dL (ref 0.3–1.2)
Total Protein: 6.6 g/dL (ref 6.5–8.1)

## 2019-03-27 LAB — ABO/RH: ABO/RH(D): O POS

## 2019-03-27 LAB — CBC WITH DIFFERENTIAL/PLATELET
Abs Immature Granulocytes: 0.02 10*3/uL (ref 0.00–0.07)
Basophils Absolute: 0.1 10*3/uL (ref 0.0–0.1)
Basophils Relative: 1 %
Eosinophils Absolute: 0.2 10*3/uL (ref 0.0–0.5)
Eosinophils Relative: 3 %
HCT: 41.2 % (ref 36.0–46.0)
Hemoglobin: 13.1 g/dL (ref 12.0–15.0)
Immature Granulocytes: 0 %
Lymphocytes Relative: 46 %
Lymphs Abs: 2.2 10*3/uL (ref 0.7–4.0)
MCH: 30.7 pg (ref 26.0–34.0)
MCHC: 31.8 g/dL (ref 30.0–36.0)
MCV: 96.5 fL (ref 80.0–100.0)
Monocytes Absolute: 0.5 10*3/uL (ref 0.1–1.0)
Monocytes Relative: 10 %
Neutro Abs: 1.9 10*3/uL (ref 1.7–7.7)
Neutrophils Relative %: 40 %
Platelets: 141 10*3/uL — ABNORMAL LOW (ref 150–400)
RBC: 4.27 MIL/uL (ref 3.87–5.11)
RDW: 13.8 % (ref 11.5–15.5)
WBC: 4.9 10*3/uL (ref 4.0–10.5)
nRBC: 0 % (ref 0.0–0.2)

## 2019-03-27 LAB — PROCALCITONIN: Procalcitonin: <0.1 ng/mL

## 2019-03-27 LAB — TROPONIN I (HIGH SENSITIVITY): Troponin I (High Sensitivity): 6 ng/L (ref <18)

## 2019-03-27 LAB — APTT: aPTT: 28 seconds (ref 24–36)

## 2019-03-27 LAB — POC SARS CORONAVIRUS 2 AG: SARS Coronavirus 2 Ag: POSITIVE — AB

## 2019-03-27 LAB — FIBRIN DERIVATIVES D-DIMER (ARMC ONLY): Fibrin derivatives D-dimer (ARMC): 1012.29 ng/mL (FEU) — ABNORMAL HIGH (ref 0.00–499.00)

## 2019-03-27 MED ORDER — ALBUTEROL SULFATE HFA 108 (90 BASE) MCG/ACT IN AERS
2.0000 | INHALATION_SPRAY | RESPIRATORY_TRACT | Status: DC | PRN
  Filled 2019-03-27: qty 6.7

## 2019-03-27 MED ORDER — SODIUM CHLORIDE 0.9 % IV SOLN: 100.0000 mg | Freq: Every day | INTRAVENOUS | Status: DC

## 2019-03-27 MED ORDER — ENOXAPARIN SODIUM 40 MG/0.4ML ~~LOC~~ SOLN
40.0000 mg | SUBCUTANEOUS | Status: DC
  Administered 2019-03-27: 40 mg via SUBCUTANEOUS
  Filled 2019-03-27: qty 0.4

## 2019-03-27 MED ORDER — SODIUM CHLORIDE 0.9 % IV SOLN
200.0000 mg | Freq: Once | INTRAVENOUS | Status: AC
  Administered 2019-03-27: 03:00:00 200 mg via INTRAVENOUS
  Filled 2019-03-27: qty 200

## 2019-03-27 MED ORDER — METHYLPREDNISOLONE SODIUM SUCC 125 MG IJ SOLR
125.0000 mg | Freq: Once | INTRAMUSCULAR | Status: AC
  Administered 2019-03-27: 02:00:00 125 mg via INTRAVENOUS
  Filled 2019-03-27: qty 2

## 2019-03-27 MED ORDER — ONDANSETRON HCL 4 MG PO TABS: 4.0000 mg | ORAL_TABLET | Freq: Four times a day (QID) | ORAL | Status: DC | PRN

## 2019-03-27 MED ORDER — TRAZODONE HCL 50 MG PO TABS
25.0000 mg | ORAL_TABLET | Freq: Every evening | ORAL | Status: DC | PRN
  Administered 2019-03-28: 22:00:00 25 mg via ORAL
  Filled 2019-03-27: qty 1

## 2019-03-27 MED ORDER — ACETAMINOPHEN 325 MG PO TABS: 650.0000 mg | ORAL_TABLET | Freq: Four times a day (QID) | ORAL | Status: DC | PRN

## 2019-03-27 MED ORDER — HYDROCOD POLST-CPM POLST ER 10-8 MG/5ML PO SUER: 5.0000 mL | Freq: Two times a day (BID) | ORAL | Status: DC | PRN

## 2019-03-27 MED ORDER — HEPARIN (PORCINE) 25000 UT/250ML-% IV SOLN
1200.0000 [IU]/h | INTRAVENOUS | Status: DC
  Administered 2019-03-27: 13:00:00 1200 [IU]/h via INTRAVENOUS
  Filled 2019-03-27: qty 250

## 2019-03-27 MED ORDER — PRAVASTATIN SODIUM 20 MG PO TABS
40.0000 mg | ORAL_TABLET | Freq: Every day | ORAL | Status: DC
  Administered 2019-03-28 – 2019-03-29 (×2): 40 mg via ORAL
  Filled 2019-03-27: qty 2
  Filled 2019-03-27: qty 1
  Filled 2019-03-27: qty 2

## 2019-03-27 MED ORDER — IOHEXOL 350 MG/ML SOLN
60.0000 mL | Freq: Once | INTRAVENOUS | Status: AC | PRN
  Administered 2019-03-27: 06:00:00 60 mL via INTRAVENOUS

## 2019-03-27 MED ORDER — ONDANSETRON HCL 4 MG/2ML IJ SOLN: 4.0000 mg | Freq: Four times a day (QID) | INTRAMUSCULAR | Status: DC | PRN

## 2019-03-27 MED ORDER — ASPIRIN EC 81 MG PO TBEC
81.0000 mg | DELAYED_RELEASE_TABLET | Freq: Every day | ORAL | Status: DC
  Administered 2019-03-29: 81 mg via ORAL
  Filled 2019-03-27 (×2): qty 1

## 2019-03-27 MED ORDER — FUROSEMIDE 20 MG PO TABS
20.0000 mg | ORAL_TABLET | Freq: Every day | ORAL | Status: DC
  Administered 2019-03-29: 10:00:00 20 mg via ORAL
  Filled 2019-03-27 (×2): qty 1

## 2019-03-27 MED ORDER — IPRATROPIUM-ALBUTEROL 0.5-2.5 (3) MG/3ML IN SOLN
3.0000 mL | Freq: Once | RESPIRATORY_TRACT | Status: AC
  Administered 2019-03-27: 01:00:00 3 mL via RESPIRATORY_TRACT
  Filled 2019-03-27: qty 3

## 2019-03-27 MED ORDER — LEVOTHYROXINE SODIUM 50 MCG PO TABS
50.0000 ug | ORAL_TABLET | ORAL | Status: DC
  Filled 2019-03-27: qty 1

## 2019-03-27 MED ORDER — MEMANTINE HCL 5 MG PO TABS
10.0000 mg | ORAL_TABLET | Freq: Two times a day (BID) | ORAL | Status: DC
  Administered 2019-03-28 – 2019-03-29 (×2): 10 mg via ORAL
  Filled 2019-03-27 (×3): qty 2

## 2019-03-27 MED ORDER — SODIUM CHLORIDE 0.9 % IV SOLN: INTRAVENOUS | Status: DC

## 2019-03-27 MED ORDER — POTASSIUM CHLORIDE ER 10 MEQ PO TBCR: 10.0000 meq | EXTENDED_RELEASE_TABLET | Freq: Every day | ORAL | Status: DC

## 2019-03-27 MED ORDER — VITAMIN D 25 MCG (1000 UNIT) PO TABS
1000.0000 [IU] | ORAL_TABLET | Freq: Every day | ORAL | Status: DC
  Administered 2019-03-29: 10:00:00 1000 [IU] via ORAL
  Filled 2019-03-27 (×2): qty 1

## 2019-03-27 MED ORDER — GUAIFENESIN-DM 100-10 MG/5ML PO SYRP
10.0000 mL | ORAL_SOLUTION | ORAL | Status: DC | PRN
  Filled 2019-03-27: qty 10

## 2019-03-27 MED ORDER — MAGNESIUM HYDROXIDE 400 MG/5ML PO SUSP: 30.0000 mL | Freq: Every day | ORAL | Status: DC | PRN

## 2019-03-27 MED ORDER — VITAMIN D 25 MCG (1000 UNIT) PO TABS: 1000.0000 [IU] | ORAL_TABLET | Freq: Every day | ORAL | Status: DC

## 2019-03-27 MED ORDER — SODIUM CHLORIDE 0.9 % IV SOLN: 200.0000 mg | Freq: Once | INTRAVENOUS | Status: DC

## 2019-03-27 MED ORDER — CITALOPRAM HYDROBROMIDE 20 MG PO TABS
20.0000 mg | ORAL_TABLET | Freq: Every day | ORAL | Status: DC
  Administered 2019-03-29: 20 mg via ORAL
  Filled 2019-03-27 (×2): qty 1

## 2019-03-27 MED ORDER — ZINC SULFATE 220 (50 ZN) MG PO CAPS
220.0000 mg | ORAL_CAPSULE | Freq: Every day | ORAL | Status: DC
  Administered 2019-03-29: 10:00:00 220 mg via ORAL
  Filled 2019-03-27 (×3): qty 1

## 2019-03-27 MED ORDER — HEPARIN BOLUS VIA INFUSION
1500.0000 [IU] | Freq: Once | INTRAVENOUS | Status: AC
  Administered 2019-03-27: 13:00:00 1500 [IU] via INTRAVENOUS
  Filled 2019-03-27: qty 1500

## 2019-03-27 MED ORDER — ASCORBIC ACID 500 MG PO TABS
500.0000 mg | ORAL_TABLET | Freq: Every day | ORAL | Status: DC
  Administered 2019-03-29: 10:00:00 500 mg via ORAL
  Filled 2019-03-27 (×2): qty 1

## 2019-03-27 MED ORDER — ALLOPURINOL 100 MG PO TABS
100.0000 mg | ORAL_TABLET | Freq: Every day | ORAL | Status: DC
  Administered 2019-03-29: 100 mg via ORAL
  Filled 2019-03-27 (×2): qty 1

## 2019-03-27 MED ORDER — DONEPEZIL HCL 5 MG PO TABS
5.0000 mg | ORAL_TABLET | Freq: Every day | ORAL | Status: DC
  Administered 2019-03-28: 22:00:00 5 mg via ORAL
  Filled 2019-03-27 (×2): qty 1

## 2019-03-27 MED ORDER — DEXAMETHASONE SODIUM PHOSPHATE 10 MG/ML IJ SOLN
6.0000 mg | INTRAMUSCULAR | Status: DC
  Administered 2019-03-27 – 2019-03-29 (×3): 6 mg via INTRAVENOUS
  Filled 2019-03-27 (×3): qty 1

## 2019-03-27 MED ORDER — GUAIFENESIN ER 600 MG PO TB12
600.0000 mg | ORAL_TABLET | Freq: Two times a day (BID) | ORAL | Status: DC
  Administered 2019-03-28 – 2019-03-29 (×2): 600 mg via ORAL
  Filled 2019-03-27 (×3): qty 1

## 2019-03-27 MED ORDER — SODIUM CHLORIDE 0.9 % IV SOLN
100.0000 mg | Freq: Every day | INTRAVENOUS | Status: DC
  Administered 2019-03-28 – 2019-03-29 (×2): 100 mg via INTRAVENOUS
  Filled 2019-03-27: qty 20
  Filled 2019-03-27 (×2): qty 100

## 2019-03-27 MED ORDER — MAGNESIUM SULFATE 2 GM/50ML IV SOLN
2.0000 g | Freq: Once | INTRAVENOUS | Status: AC
  Administered 2019-03-27: 02:00:00 2 g via INTRAVENOUS
  Filled 2019-03-27: qty 50

## 2019-03-27 MED ORDER — FAMOTIDINE 20 MG PO TABS
20.0000 mg | ORAL_TABLET | Freq: Every day | ORAL | Status: DC
  Administered 2019-03-29: 20 mg via ORAL
  Filled 2019-03-27 (×2): qty 1

## 2019-03-27 NOTE — ED Notes (Signed)
Pt in ct 

## 2019-03-27 NOTE — ED Notes (Signed)
Pt refusing to eat lunch - confused. States she needs to get ready for work and doesn't have time to eat.

## 2019-03-27 NOTE — ED Notes (Signed)
Pt to CT

## 2019-03-27 NOTE — ED Notes (Signed)
Pt sleeping. 

## 2019-03-27 NOTE — ED Notes (Signed)
Report to lorrie, rn.  

## 2019-03-27 NOTE — ED Notes (Signed)
Attempted to help pt drink some water - pt is unable to follow any directions. Does not seem to know how to suck from a straw or pick up the cup herself d/t her confusion. Previous nurse had attempted to give pills with apple sauce and pt was unable to follow directions to swallow the applesauce and spit it all out onto that nurse.

## 2019-03-27 NOTE — H&P (Addendum)
[place] at [place] NAME: Stephanie Duarte    MR#:  [id number]  DATE OF BIRTH:  06-12-1928  DATE OF ADMISSION:  03/27/2019  PRIMARY CARE PHYSICIAN: [facility]   REQUESTING/REFERRING PHYSICIAN: Gonzella Lex, MD  CHIEF COMPLAINT:   Chief Complaint  Patient presents with  . Shortness of Breath    HISTORY OF PRESENT ILLNESS:  Stephanie Duarte  is a 86 y.o. female with a known history of asthma, dyslipidemia, hypertension and dementia, presented to the emergency room with acute onset of hypoxia and respiratory distress at her skilled nursing facility.  She was recently diagnosed with COVID-19 a couple days ago.  She dropped her pulse oximetry to 86% before she came to the ER was having wheezing with increased work of breathing.  Upon arrival to the ER she was satting at 90%.  She had audible wheezing and labored breathing.  The patient is a nonhistorian due to advanced dementia.  When she came to the ER her respiratory rate was 21 and later 23 with otherwise normal vital signs.  She dropped here to 87% on room air and that came up to 96 sent on 2 L with 20 scan.  Labs revealed a creatinine of 1.15 and procalcitonin less than 0.1 with unremarkable CBC.  A fibrin derivatives D-dimer was 1012.  COVID-19 antigen test came back positive.  Chest x-ray showed stable cardiac shadow and aortic calcifications.  The lungs were well aerated bilaterally.  There was mild scarring seen in the left base that is old.  She had a chest CTA which report is currently pending.  The patient was given duo nebs x3 due to excessive wheezing and 2 g of IV magnesium sulfate, 125 mg of IV Solu-Medrol as well as IV remdesivir.  She will be admitted to a medical monitored isolation bed for further evaluation and management. PAST MEDICAL HISTORY:   Past Medical History:  Diagnosis Date  . Asthma   . Dementia ([place])   . Hyperlipidemia   . Hypertension     PAST SURGICAL HISTORY:    History reviewed. No pertinent surgical history.  Unobtainable due to advanced dementia.  SOCIAL HISTORY:   Social History   Tobacco Use  . Smoking status: Never Smoker  . Smokeless tobacco: Never Used  Substance Use Topics  . Alcohol use: No    FAMILY HISTORY:  History reviewed. No pertinent family history.  Unobtainable due to advanced dementia.  DRUG ALLERGIES:   Allergies  Allergen Reactions  . Meloxicam Swelling    REVIEW OF SYSTEMS:   ROS As per history of present illness. All pertinent systems were reviewed above. Constitutional,  HEENT, cardiovascular, respiratory, GI, GU, musculoskeletal, neuro, psychiatric, endocrine,  integumentary and hematologic systems were reviewed and are otherwise  negative/unremarkable except for positive findings mentioned above in the HPI.   MEDICATIONS AT HOME:   Prior to Admission medications   Medication Sig Start Date End Date Taking? Authorizing Provider  acetaminophen (TYLENOL) 325 MG tablet Take 2 tablets (650 mg total) by mouth every 6 (six) hours as needed for mild pain. 07/07/15   Gouru, Illene Silver, MD  albuterol (PROVENTIL) (2.5 MG/3ML) 0.083% nebulizer solution Dx J44.1 Patient taking differently: Take 2.5 mg by nebulization 2 (two) times daily.  03/17/18   Loletha Grayer, MD  allopurinol (ZYLOPRIM) 100 MG tablet Take 100 mg by mouth daily.    [provider]  aspirin 81 MG tablet Take 81 mg by mouth daily.  [provider]  budesonide (PULMICORT) 0.5 MG/2ML nebulizer solution Dx J44.1 03/17/18   Alford Highland, MD  cholecalciferol (VITAMIN D) 1000 units tablet Take 1,000 Units by mouth daily.     [provider]  citalopram (CELEXA) 20 MG tablet Take 20 mg by mouth daily.    [provider]  donepezil (ARICEPT) 5 MG tablet Take 5 mg by mouth at bedtime.    [provider]  doxycycline (VIBRAMYCIN) 50 MG/5ML SYRP Take 10 mLs (100 mg total) by mouth 2 (two) times daily. 04/13/18    Mayo, Allyn Kenner, MD  furosemide (LASIX) 20 MG tablet Take 20 mg by mouth daily.    [provider]  guaiFENesin-dextromethorphan (ROBITUSSIN DM) 100-10 MG/5ML syrup Take 5 mLs by mouth every 6 (six) hours as needed.    [provider]  levothyroxine (SYNTHROID, LEVOTHROID) 50 MCG tablet Take 50 mcg by mouth every morning.    [provider]  memantine (NAMENDA) 10 MG tablet Take 10 mg by mouth 2 (two) times daily.     [provider]  metFORMIN (GLUCOPHAGE) 500 MG tablet Take 500 mg by mouth daily.    [provider]  potassium chloride (K-DUR) 10 MEQ tablet Take 10 mEq by mouth daily.    [provider]  pravastatin (PRAVACHOL) 40 MG tablet Take 40 mg by mouth daily.    [provider]  predniSONE (DELTASONE) 50 MG tablet Take 1 tablet (50 mg total) by mouth daily with breakfast. 04/14/18   Mayo, Allyn Kenner, MD  Skin Protectants, Misc. (EUCERIN) cream Apply topically 4 (four) times daily as needed.    [provider]      VITAL SIGNS:  Blood pressure (!) 131/105, pulse 71, temperature 98.1 F (36.7 C), temperature source Oral, resp. rate 17, height 5\' 5"  (1.651 m), weight 86.2 kg, SpO2 96 %.  PHYSICAL EXAMINATION:  Physical Exam  GENERAL:  86 y.o.-year-old female patient lying in the bed with no acute distress.  EYES: Pupils equal, round, reactive to light and accommodation. No scleral icterus. Extraocular muscles intact.  HEENT: Head atraumatic, normocephalic. Oropharynx and nasopharynx clear.  NECK:  Supple, no jugular venous distention. No thyroid enlargement, no tenderness.  LUNGS: Diffuse expiratory wheezes with slightly diminished expiratory airflow and harsh vesicular breathing. CARDIOVASCULAR: Regular rate and rhythm, S1, S2 normal. No murmurs, rubs, or gallops.  ABDOMEN: Soft, nondistended, nontender. Bowel sounds present. No organomegaly or mass.  EXTREMITIES: No pedal edema, cyanosis, or clubbing.    NEUROLOGIC: Grossly nonfocal. PSYCHIATRIC: The patient is alert and nonverbal.  SKIN: No obvious rash, lesion, or ulcer.   LABORATORY PANEL:   CBC Recent Labs  Lab 03/27/19 0121  WBC 4.9  HGB 13.1  HCT 41.2  PLT 141*   ------------------------------------------------------------------------------------------------------------------  Chemistries  Recent Labs  Lab 03/27/19 0121  NA 138  K 4.6  CL 98  CO2 30  GLUCOSE 124*  BUN 22  CREATININE 1.15*  CALCIUM 9.0  AST 36  ALT 41  ALKPHOS 78  BILITOT 0.7   ------------------------------------------------------------------------------------------------------------------  Cardiac Enzymes No results for input(s): TROPONINI in the last 168 hours. ------------------------------------------------------------------------------------------------------------------  RADIOLOGY:  DG Chest Portable 1 View  Result Date: 03/27/2019 CLINICAL DATA:  Cough and hypoxia, positive COVID-19 test EXAM: PORTABLE CHEST 1 VIEW COMPARISON:  04/11/2018 FINDINGS: Cardiac shadow is stable. Aortic calcifications are again seen. The lungs are well aerated bilaterally. Mild scarring is again seen in the left base. No bony abnormality is seen. IMPRESSION: No acute abnormality  noted. Electronically Signed   By: Alcide Clever M.D.   On: 03/27/2019 01:39      IMPRESSION AND PLAN:   1.  Acute asthma exacerbation likely secondary to COVID-19. -Patient will be admitted to a medical monitored bed. -She will placed on continued steroid therapy with IV Decadron as well as bronchodilator therapy with albuterol MDI on a scheduled as needed basis. -We are holding her nebulizer therapy for now given her positive Covid.  If she has no improvement on inhaler she can certainly be switched to nebulized bronchodilators  2.  Acute hypoxemic respiratory failure due to COVID-19 and asthma exacerbation. -Management for asthma as above. -The patient will be placed on IV  remdesivir and Decadron. -We will place her on vitamin D3, vitamin C, zinc, p.o. Pepcid and aspirin.  3.  Dementia and depression. -We will continue Aricept, Namenda and Celexa.  4.  Dyslipidemia. -We will continue statin therapy.  5. Gout. -We will continue allopurinol.  6.  Hypothyroidism. -We will check TSH and continue Synthroid.  7.  DVT prophylaxis. -Subcutaneous Lovenox.   All the records are reviewed and case discussed with ED provider. The plan of care was discussed in details with the patient (and family). I answered all questions. The patient agreed to proceed with the above mentioned plan. Further management will depend upon hospital course.   CODE STATUS: The patient is DNR/DNI.  TOTAL TIME TAKING CARE OF THIS PATIENT: 55 minutes.    Hannah Beat M.D on 03/27/2019 at 3:56 AM  [facility]   From 7 PM-7 AM, contact night-coverage [email or web address]  CC: Primary care physician; [facility]   Note: This dictation was prepared with Dragon dictation along with smaller phrase technology. Any transcriptional errors that result from this process are unintentional.

## 2019-03-27 NOTE — ED Notes (Signed)
Pt's O2 sats drop to 88% while sleeping

## 2019-03-27 NOTE — Progress Notes (Signed)
PROGRESS NOTE    Stephanie Duarte  JAS:[id number] DOB: [birth year] DOA: 03/27/2019 PCP: Medicine, [facility]      Brief Narrative:  Stephanie Duarte is a 86 y.o. F with dementia lives in [facility] memory care, asthma, and HTN who presented with hypoxia and respiratory distress for 1 day.    Diagnosed with COVID-19 few days PTA.  On day of admission, SpO2 86% and had increased work of breathing.    In the ER, SpO2 87% and RR 23.  COVID+.  CXR clear.  CTA showed nonocclusive right LL pulmonary artery.  Given Solu-medrol and bronchodilators for wheezing.  Started on IV remdesivir.          Assessment & Plan:  Covid pneumonitis with acute hypoxic respiratory failure -Continue remdesivir -Continue steroids -Continue zinc, vitamin C  Pulmonary embolism Presents with new onset respiratory symptoms, new COVID diagnosis.  Angiography shows nonocclusive filling defect in RLL pulmonary arteries, interpreted by Radiology as "subacute or chronic" PE.  While acknowledging this diagnostic ambiguity, the acute onset of symptoms, the known pro-inflammatory and suspected pro-coagulant state of COVID (reflected in her elevated dimers), and the fact the patient has no prior history of VTE, this must be assumed to be acute and warranting anticoagulation. -Start heparin gtt  Hypothyroidism -Continue levothyroxine  Diabetes -Hold metformin -Start SS corrections  Dementia with acute metabolic encephalopathy -Continue donepezil, citalopram, memantine  Asthma without exacerbation  Hypertension History TIA, CV disease secondary prevention -Continue aspirin, pravastatin -Continue furosemide  Gout -Continue allopurinol  GERD -Continue Pepcid   CKD IIIb Baseline Cr 1.1, stable relative to baseline.       DVT prophylaxis: N/A has PE Code Status: DNR Family Communication:  MDM and disposition Plan: This is a no charge note.  For further details, please see H&P by my partner Dr.  Arville Care from earlier today.  The below labs and imaging reports were reviewed and summarized above.    The patient was admitted with COVID 19 and PE    Objective: Vitals:   03/27/19 0400 03/27/19 0430 03/27/19 0500 03/27/19 0530  BP: 105/60 112/62 (!) 104/57 122/66  Pulse: (!) 59 (!) 59 (!) 59 (!) 58  Resp: 18 18 17 16   Temp:      TempSrc:      SpO2: 100% 99% 98% 99%  Weight:      Height:        Intake/Output Summary (Last 24 hours) at 03/27/2019 0750 Last data filed at 03/27/2019 0339 Gross per 24 hour  Intake 340 ml  Output --  Net 340 ml   Filed Weights   03/27/19 0052  Weight: 86.2 kg    Examination: The patient was seen and examined.      Data Reviewed: I have personally reviewed following labs and imaging studies:  CBC: Recent Labs  Lab 03/27/19 0121  WBC 4.9  NEUTROABS 1.9  HGB 13.1  HCT 41.2  MCV 96.5  PLT 141*   Basic Metabolic Panel: Recent Labs  Lab 03/27/19 0121  NA 138  K 4.6  CL 98  CO2 30  GLUCOSE 124*  BUN 22  CREATININE 1.15*  CALCIUM 9.0   GFR: Estimated Creatinine Clearance: 36.7 mL/min (A) (by C-G formula based on SCr of 1.15 mg/dL (H)). Liver Function Tests: Recent Labs  Lab 03/27/19 0121  AST 36  ALT 41  ALKPHOS 78  BILITOT 0.7  PROT 6.6  ALBUMIN 3.7   No results for input(s): LIPASE, AMYLASE in the last 168  hours. No results for input(s): AMMONIA in the last 168 hours. Coagulation Profile: No results for input(s): INR, PROTIME in the last 168 hours. Cardiac Enzymes: No results for input(s): CKTOTAL, CKMB, CKMBINDEX, TROPONINI in the last 168 hours. BNP (last 3 results) No results for input(s): PROBNP in the last 8760 hours. HbA1C: No results for input(s): HGBA1C in the last 72 hours. CBG: No results for input(s): GLUCAP in the last 168 hours. Lipid Profile: No results for input(s): CHOL, HDL, LDLCALC, TRIG, CHOLHDL, LDLDIRECT in the last 72 hours. Thyroid Function Tests: No results for input(s): TSH, T4TOTAL,  FREET4, T3FREE, THYROIDAB in the last 72 hours. Anemia Panel: No results for input(s): VITAMINB12, FOLATE, FERRITIN, TIBC, IRON, RETICCTPCT in the last 72 hours. Urine analysis:    Component Value Date/Time   COLORURINE STRAW (A) 03/22/2017 1251   APPEARANCEUR CLEAR (A) 03/22/2017 1251   LABSPEC 1.010 03/22/2017 1251   PHURINE 5.0 03/22/2017 1251   GLUCOSEU NEGATIVE 03/22/2017 1251   HGBUR NEGATIVE 03/22/2017 1251   BILIRUBINUR NEGATIVE 03/22/2017 1251   KETONESUR NEGATIVE 03/22/2017 1251   PROTEINUR NEGATIVE 03/22/2017 1251   NITRITE NEGATIVE 03/22/2017 1251   LEUKOCYTESUR NEGATIVE 03/22/2017 1251   Sepsis Labs: @LABRCNTIP (procalcitonin:4,lacticacidven:4)  )No results found for this or any previous visit (from the past 240 hour(s)).       Radiology Studies: CT Angio Chest PE W and/or Wo Contrast  Result Date: 03/27/2019 CLINICAL DATA:  COVID positive.  Short of breath. EXAM: CT ANGIOGRAPHY CHEST WITH CONTRAST TECHNIQUE: Multidetector CT imaging of the chest was performed using the standard protocol during bolus administration of intravenous contrast. Multiplanar CT image reconstructions and MIPs were obtained to evaluate the vascular anatomy. CONTRAST:  35mL OMNIPAQUE IOHEXOL 350 MG/ML SOLN COMPARISON:  None. FINDINGS: Cardiovascular: There is wall adherent clot within the RIGHT lower lobe pulmonary artery (image 49/4 and image 51/4). These are nonocclusive. There is respiratory image degradation which limits evaluation of the more distal lower lobe pulmonary arteries. Mediastinum/Nodes: No axillary supraclavicular adenopathy. Enlarged thyroid nodule on the RIGHT is not changed from 2017 measuring 2.5 cm short axis. No mediastinal lymphadenopathy. Lungs/Pleura: No airspace disease. No pulmonary infarction. No pleural fluid. Mild basilar atelectasis. Upper Abdomen: Low-density lesions in liver consistent benign cysts. Small hiatal hernia. Musculoskeletal: No aggressive osseous lesion.  Degenerative osteophytosis of the spine. Review of the MIP images confirms the above findings. IMPRESSION: 1. Small volume wall adherent clot within the RIGHT lower lobe pulmonary arteries is most consistent with subacute/chronic pulmonary embolism. These small clots are nonocclusive. No clear evidence of acute pulmonary embolism. 2. No pulmonary infarction. 3. No evidence of pneumonia. 4. Coronary artery calcification and Aortic Atherosclerosis (ICD10-I70.0). Findings conveyed toCAROLINA VERONESE on 03/27/2019  at07:18. Electronically Signed   By: Suzy Bouchard M.D.   On: 03/27/2019 07:18   DG Chest Portable 1 View  Result Date: 03/27/2019 CLINICAL DATA:  Cough and hypoxia, positive COVID-19 test EXAM: PORTABLE CHEST 1 VIEW COMPARISON:  04/11/2018 FINDINGS: Cardiac shadow is stable. Aortic calcifications are again seen. The lungs are well aerated bilaterally. Mild scarring is again seen in the left base. No bony abnormality is seen. IMPRESSION: No acute abnormality noted. Electronically Signed   By: Inez Catalina M.D.   On: 03/27/2019 01:39        Scheduled Meds: . allopurinol  100 mg Oral Daily  . vitamin C  500 mg Oral Daily  . aspirin EC  81 mg Oral Daily  . cholecalciferol  1,000 Units Oral Daily  .  citalopram  20 mg Oral Daily  . dexamethasone (DECADRON) injection  6 mg Intravenous Q24H  . donepezil  5 mg Oral QHS  . enoxaparin (LOVENOX) injection  40 mg Subcutaneous Q24H  . famotidine  20 mg Oral Daily  . furosemide  20 mg Oral Daily  . guaiFENesin  600 mg Oral BID  . levothyroxine  50 mcg Oral BH-q7a  . memantine  10 mg Oral BID  . pravastatin  40 mg Oral Daily  . zinc sulfate  220 mg Oral Daily   Continuous Infusions: . sodium chloride 75 mL/hr at 03/27/19 0616  . [START ON 03/28/2019] remdesivir 100 mg in NS 100 mL       LOS: 0 days    Time spent: 15 mnutes    Alberteen Sam, MD [facility] 03/27/2019, 7:50 AM     Please page though AMION or Epic  secure chat:  For password, contact charge nurse

## 2019-03-27 NOTE — ED Provider Notes (Signed)
[facility] Emergency Department Provider Note  ____________________________________________  Time seen: Approximately 12:59 AM  I have reviewed the triage vital signs and the nursing notes.   HISTORY  Chief Complaint Shortness of Breath  Level 5 caveat:  Portions of the history and physical were unable to be obtained due to dementia   HPI Stephanie Duarte is a 86 y.o. female with a history of asthma, Alzheimer's dementia, hypertension, hyperlipidemia  who presents for evaluation of hypoxia and difficulty breathing.  According to the nursing home, patient was recently diagnosed with Covid.  Today she was hypoxic to 86%, wheezing, with increased work of breathing which prompted 911 to be called.  When EMS arrived patient was satting 90% on room air.  Patient with audible wheezing and increased work of breathing.  Patient will not provide any history due to advanced dementia.  Past Medical History:  Diagnosis Date  . Asthma   . Dementia (HCC)   . Hyperlipidemia   . Hypertension     Patient Active Problem List   Diagnosis Date Noted  . COVID-19 03/27/2019  . Asthma exacerbation 04/11/2018  . Pneumonia 03/12/2018  . Fever 07/04/2015    History reviewed. No pertinent surgical history.  Prior to Admission medications   Medication Sig Start Date End Date Taking? Authorizing Provider  acetaminophen (TYLENOL) 325 MG tablet Take 2 tablets (650 mg total) by mouth every 6 (six) hours as needed for mild pain. 07/07/15   Gouru, Deanna Artis, MD  albuterol (PROVENTIL) (2.5 MG/3ML) 0.083% nebulizer solution Dx J44.1 Patient taking differently: Take 2.5 mg by nebulization 2 (two) times daily.  03/17/18   Alford Highland, MD  allopurinol (ZYLOPRIM) 100 MG tablet Take 100 mg by mouth daily.    [provider]  aspirin 81 MG tablet Take 81 mg by mouth daily.     [provider]  budesonide (PULMICORT) 0.5 MG/2ML nebulizer solution Dx J44.1 03/17/18   Alford Highland, MD  cholecalciferol (VITAMIN D) 1000 units tablet Take 1,000 Units by mouth daily.     [provider]  citalopram (CELEXA) 20 MG tablet Take 20 mg by mouth daily.    [provider]  donepezil (ARICEPT) 5 MG tablet Take 5 mg by mouth at bedtime.    [provider]  doxycycline (VIBRAMYCIN) 50 MG/5ML SYRP Take 10 mLs (100 mg total) by mouth 2 (two) times daily. 04/13/18   Mayo, Allyn Kenner, MD  furosemide (LASIX) 20 MG tablet Take 20 mg by mouth daily.    [provider]  guaiFENesin-dextromethorphan (ROBITUSSIN DM) 100-10 MG/5ML syrup Take 5 mLs by mouth every 6 (six) hours as needed.    [provider]  levothyroxine (SYNTHROID, LEVOTHROID) 50 MCG tablet Take 50 mcg by mouth every morning.    [provider]  memantine (NAMENDA) 10 MG tablet Take 10 mg by mouth 2 (two) times daily.     [provider]  metFORMIN (GLUCOPHAGE) 500 MG tablet Take 500 mg by mouth daily.    [provider]  potassium chloride (K-DUR) 10 MEQ tablet Take 10 mEq by mouth daily.    [provider]  pravastatin (PRAVACHOL) 40 MG tablet Take 40 mg by mouth daily.    [provider]  predniSONE (DELTASONE) 50 MG tablet Take 1 tablet (50 mg total) by mouth daily with breakfast. 04/14/18   Mayo, Allyn Kenner, MD  Skin Protectants, Misc. (EUCERIN) cream Apply topically 4 (four) times daily as needed.  [provider]    Allergies Meloxicam  History reviewed. No pertinent family history.  Social History Social History   Tobacco Use  . Smoking status: Never Smoker  . Smokeless tobacco: Never Used  Substance Use Topics  . Alcohol use: No  . Drug use: Not on file    Review of Systems  Constitutional: Negative for fever. Respiratory: + SOB  ____________________________________________   PHYSICAL EXAM:  VITAL SIGNS: ED Triage Vitals  Enc Vitals Group     BP 03/27/19 0051 121/72     Pulse Rate 03/27/19 0051  69     Resp 03/27/19 0051 (!) 21     Temp 03/27/19 0051 98.1 F (36.7 C)     Temp Source 03/27/19 0051 Oral     SpO2 03/27/19 0045 90 %     Weight 03/27/19 0052 190 lb (86.2 kg)     Height 03/27/19 0052 5\' 5"  (1.651 m)     Head Circumference --      Peak Flow --      Pain Score --      Pain Loc --      Pain Edu? --      Excl. in [place]? --     Constitutional: Awake with increased work of breathing  HEENT:      Head: Normocephalic and atraumatic.         Eyes: Conjunctivae are normal. Sclera is non-icteric.       Mouth/Throat: Mucous membranes are moist.       Neck: Supple with no signs of meningismus. Cardiovascular: Regular rate and rhythm. No murmurs, gallops, or rubs. 2+ symmetrical distal pulses are present in all extremities. No JVD. Respiratory: Tachypneic with audible wheezing, increased work of breathing, satting 92-93% on room air  gastrointestinal: Soft, non tender, and non distended with positive bowel sounds. No rebound or guarding. Musculoskeletal:. No edema, cyanosis, or erythema of extremities. Neurologic: Face is symmetric. Moving all extremities. No gross focal neurologic deficits are appreciated. Skin: Skin is warm, dry and intact. No rash noted. Psychiatric: Mood and affect are normal. Speech and behavior are normal.  ____________________________________________   LABS (all labs ordered are listed, but only abnormal results are displayed)  Labs Reviewed  CBC WITH DIFFERENTIAL/PLATELET - Abnormal; Notable for the following components:      Result Value   Platelets 141 (*)    All other components within normal limits  COMPREHENSIVE METABOLIC PANEL - Abnormal; Notable for the following components:   Glucose, Bld 124 (*)    Creatinine, Ser 1.15 (*)    GFR calc non Af Amer 42 (*)    GFR calc Af Amer 49 (*)    All other components within normal limits  FIBRIN DERIVATIVES D-DIMER ([facility] ONLY) - Abnormal; Notable for the following components:   Fibrin derivatives  D-dimer ([facility]) 1,012.29 (*)    All other components within normal limits  POC SARS CORONAVIRUS 2 AG - Abnormal; Notable for the following components:   SARS Coronavirus 2 Ag POSITIVE (*)    All other components within normal limits  PROCALCITONIN  POC SARS CORONAVIRUS 2 AG -  ED  TROPONIN I (HIGH SENSITIVITY)   ____________________________________________  EKG  ED ECG REPORT I, Rudene Re, the attending physician, personally viewed and interpreted this ECG.  Normal sinus rhythm, rate of 69, right bundle branch block, normal QTC, normal axis, no ST elevations or depressions.  Unchanged from prior. ____________________________________________  RADIOLOGY  I have personally reviewed the images performed during  this visit and I agree with the Radiologist's read.   Interpretation by Radiologist:  DG Chest Portable 1 View  Result Date: 03/27/2019 CLINICAL DATA:  Cough and hypoxia, positive COVID-19 test EXAM: PORTABLE CHEST 1 VIEW COMPARISON:  04/11/2018 FINDINGS: Cardiac shadow is stable. Aortic calcifications are again seen. The lungs are well aerated bilaterally. Mild scarring is again seen in the left base. No bony abnormality is seen. IMPRESSION: No acute abnormality noted. Electronically Signed   By: Alcide Clever M.D.   On: 03/27/2019 01:39      ____________________________________________   PROCEDURES  Procedure(s) performed: None Procedures Critical Care performed: yes  CRITICAL CARE Performed by: Nita Sickle  ?  Total critical care time: 35 min  Critical care time was exclusive of separately billable procedures and treating other patients.  Critical care was necessary to treat or prevent imminent or life-threatening deterioration.  Critical care was time spent personally by me on the following activities: development of treatment plan with patient and/or surrogate as well as nursing, discussions with consultants, evaluation of patient's response to  treatment, examination of patient, obtaining history from patient or surrogate, ordering and performing treatments and interventions, ordering and review of laboratory studies, ordering and review of radiographic studies, pulse oximetry and re-evaluation of patient's condition.  ____________________________________________   INITIAL IMPRESSION / ASSESSMENT AND PLAN / ED COURSE  86 y.o. female with a history of asthma, Alzheimer's dementia, hypertension, hyperlipidemia  who presents for evaluation of hypoxia and difficulty breathing.  Patient arrives in moderate respiratory distress, tachypneic, wheezing, sats between 90 to 93%.  Patient with advanced dementia unable to provide any further history.  According to the facility positive for Covid recently although no documentation of test was sent with patient or found on epic.  We will do a rapid Covid.  Will get a chest x-ray and basic labs.  Will get an EKG.  Will start patient on steroids, duo nebs and magnesium.  Differential diagnosis including Covid pneumonia versus bacterial pneumonia versus flu versus asthma exacerbation versus edema.  Patient is DNR/DNI.  Will monitor respiratory status closely.  Clinical Course as of Mar 27 447  Sat Mar 27, 2019  0222 Rapid COVID positive. Patient now hypoxic to 88%, placed on 2L [place]. Will initiate remdesivir. Steroids given. CXR clear. Waiting for d-dimer and procalcitonin. If dimer elevated will start patient on lovenox. If procal elevated will start abx.    [CV]    Clinical Course User Index [CV] Don Perking [place], MD    _________________________ 3:38 AM on 03/27/2019 ----------------------------------------- Procalcitonin WNL.  D-dimer elevated.  Discussed with Dr. Arville Care who recommended holding off anticoagulation at this time and getting a CT scan to rule out PE.  CT is pending.  Patient be admitted to his service.   As part of my medical decision making, I reviewed the following data within the  electronic MEDICAL RECORD NUMBER Nursing notes reviewed and incorporated, Labs reviewed , EKG interpreted , Old EKG reviewed, Old chart reviewed, Radiograph reviewed , Discussed with admitting physician  Notes from prior ED visits and  Controlled Substance Database   Please note:  Patient was evaluated in Emergency Department today for the symptoms described in the history of present illness. Patient was evaluated in the context of the global COVID-19 pandemic, which necessitated consideration that the patient might be at risk for infection with the SARS-CoV-2 virus that causes COVID-19. Institutional protocols and algorithms that pertain to the evaluation of patients at risk for COVID-19 are in  a state of rapid change based on information released by regulatory bodies including the CDC and federal and state organizations. These policies and algorithms were followed during the patient's care in the ED.  Some ED evaluations and interventions may be delayed as a result of limited staffing during the pandemic.   ____________________________________________   FINAL CLINICAL IMPRESSION(S) / ED DIAGNOSES   Final diagnoses:  Acute respiratory failure with hypoxia (HCC)  COVID-19      NEW MEDICATIONS STARTED DURING THIS VISIT:  ED Discharge Orders    None       Note:  This document was prepared using Dragon voice recognition software and may include unintentional dictation errors.    Nita Sickle, MD 03/27/19 [phone]

## 2019-03-27 NOTE — ED Notes (Signed)
Pt keeps talking off her oxygen. When on room air her sats are from 89 to 92%.

## 2019-03-27 NOTE — Progress Notes (Signed)
ANTICOAGULATION CONSULT NOTE Pharmacy Consult for heparin drip management  Indication: Pulmonary Embolism  Allergies  Allergen Reactions  . Meloxicam Swelling    Patient Measurements: Height: 5\' 5"  (165.1 cm) Weight: 190 lb (86.2 kg) IBW/kg (Calculated) : 57 Heparin Dosing Weight: 75.7kg   Vital Signs: BP: 114/72 (01/16 1533) Pulse Rate: 87 (01/16 1533)  Labs: Recent Labs    03/27/19 0121  HGB 13.1  HCT 41.2  PLT 141*  APTT 28  CREATININE 1.15*  TROPONINIHS 6    Estimated Creatinine Clearance: 36.7 mL/min (A) (by C-G formula based on SCr of 1.15 mg/dL (H)).   Medical History: Past Medical History:  Diagnosis Date  . Asthma   . Dementia (HCC)   . Hyperlipidemia   . Hypertension     Medications:  Scheduled:  . allopurinol  100 mg Oral Daily  . vitamin C  500 mg Oral Daily  . aspirin EC  81 mg Oral Daily  . cholecalciferol  1,000 Units Oral Daily  . citalopram  20 mg Oral Daily  . dexamethasone (DECADRON) injection  6 mg Intravenous Q24H  . donepezil  5 mg Oral QHS  . famotidine  20 mg Oral Daily  . furosemide  20 mg Oral Daily  . guaiFENesin  600 mg Oral BID  . levothyroxine  50 mcg Oral BH-q7a  . memantine  10 mg Oral BID  . pravastatin  40 mg Oral Daily  . zinc sulfate  220 mg Oral Daily   Infusions:  . sodium chloride Stopped (03/27/19 0620)  . heparin 1,200 Units/hr (03/27/19 1259)  . [START ON 03/28/2019] remdesivir 100 mg in NS 100 mL      Assessment: Pharmacy consulted for heparin drip management for 86 yo female admitted with COVID-19. Patient previously received enoxaparin 40mg  SQ, last dose at 0619. Angiography showing subacute or chronic PE.   Goal of Therapy:  Heparin level 0.3-0.7 units/ml Monitor platelets by anticoagulation protocol: Yes   Plan:  Will initiate partial bolus of heaprin 1500 units and start infusion at 1200 units/hr. Will order initial anti-Xa level at 2100. Will obtain CBC with am labs.   Pharmacy will continue to  monitor and adjust per consult.   Ananiah Maciolek L 03/27/2019,3:44 PM

## 2019-03-27 NOTE — ED Triage Notes (Signed)
Patient presents to Emergency Department via [facility] from [facility] with complaints of low pulse ox.  facility reports 82%  EMS reports 90% - both report audible wheeze with hx of COPD and smoking hx  COVID POSITIVE per facility - unkown test date      H

## 2019-03-28 LAB — COMPREHENSIVE METABOLIC PANEL
ALT: 28 U/L (ref 0–44)
AST: 27 U/L (ref 15–41)
Albumin: 3.1 g/dL — ABNORMAL LOW (ref 3.5–5.0)
Alkaline Phosphatase: 62 U/L (ref 38–126)
Anion gap: 9 (ref 5–15)
BUN: 24 mg/dL — ABNORMAL HIGH (ref 8–23)
CO2: 28 mmol/L (ref 22–32)
Calcium: 8.6 mg/dL — ABNORMAL LOW (ref 8.9–10.3)
Chloride: 102 mmol/L (ref 98–111)
Creatinine, Ser: 1.16 mg/dL — ABNORMAL HIGH (ref 0.44–1.00)
GFR calc Af Amer: 49 mL/min — ABNORMAL LOW (ref >60)
GFR calc non Af Amer: 42 mL/min — ABNORMAL LOW (ref >60)
Glucose, Bld: 108 mg/dL — ABNORMAL HIGH (ref 70–99)
Potassium: 4.1 mmol/L (ref 3.5–5.1)
Sodium: 139 mmol/L (ref 135–145)
Total Bilirubin: 0.7 mg/dL (ref 0.3–1.2)
Total Protein: 5.9 g/dL — ABNORMAL LOW (ref 6.5–8.1)

## 2019-03-28 LAB — CBC WITH DIFFERENTIAL/PLATELET
Abs Immature Granulocytes: 0.05 10*3/uL (ref 0.00–0.07)
Basophils Absolute: 0 10*3/uL (ref 0.0–0.1)
Basophils Relative: 0 %
Eosinophils Absolute: 0 10*3/uL (ref 0.0–0.5)
Eosinophils Relative: 0 %
HCT: 36.2 % (ref 36.0–46.0)
Hemoglobin: 11.6 g/dL — ABNORMAL LOW (ref 12.0–15.0)
Immature Granulocytes: 1 %
Lymphocytes Relative: 26 %
Lymphs Abs: 1.9 10*3/uL (ref 0.7–4.0)
MCH: 30.7 pg (ref 26.0–34.0)
MCHC: 32 g/dL (ref 30.0–36.0)
MCV: 95.8 fL (ref 80.0–100.0)
Monocytes Absolute: 0.6 10*3/uL (ref 0.1–1.0)
Monocytes Relative: 8 %
Neutro Abs: 4.8 10*3/uL (ref 1.7–7.7)
Neutrophils Relative %: 65 %
Platelets: 146 10*3/uL — ABNORMAL LOW (ref 150–400)
RBC: 3.78 MIL/uL — ABNORMAL LOW (ref 3.87–5.11)
RDW: 13.9 % (ref 11.5–15.5)
WBC: 7.3 10*3/uL (ref 4.0–10.5)
nRBC: 0 % (ref 0.0–0.2)

## 2019-03-28 LAB — FERRITIN: Ferritin: 224 ng/mL (ref 11–307)

## 2019-03-28 LAB — GLUCOSE, CAPILLARY
Glucose-Capillary: 102 mg/dL — ABNORMAL HIGH (ref 70–99)
Glucose-Capillary: 134 mg/dL — ABNORMAL HIGH (ref 70–99)
Glucose-Capillary: 145 mg/dL — ABNORMAL HIGH (ref 70–99)

## 2019-03-28 LAB — HEPARIN LEVEL (UNFRACTIONATED)
Heparin Unfractionated: 0.48 IU/mL (ref 0.30–0.70)
Heparin Unfractionated: 1.02 IU/mL — ABNORMAL HIGH (ref 0.30–0.70)
Heparin Unfractionated: 1.96 IU/mL — ABNORMAL HIGH (ref 0.30–0.70)

## 2019-03-28 LAB — FIBRIN DERIVATIVES D-DIMER (ARMC ONLY): Fibrin derivatives D-dimer (ARMC): 1132.86 ng/mL (FEU) — ABNORMAL HIGH (ref 0.00–499.00)

## 2019-03-28 LAB — HEMOGLOBIN A1C
Hgb A1c MFr Bld: 6.9 % — ABNORMAL HIGH (ref 4.8–5.6)
Mean Plasma Glucose: 151.33 mg/dL

## 2019-03-28 LAB — C-REACTIVE PROTEIN: CRP: 0.6 mg/dL (ref <1.0)

## 2019-03-28 MED ORDER — SODIUM CHLORIDE 0.9 % IV SOLN: INTRAVENOUS | Status: DC

## 2019-03-28 MED ORDER — LEVOTHYROXINE SODIUM 100 MCG/5ML IV SOLN
25.0000 ug | Freq: Every day | INTRAVENOUS | Status: DC
  Administered 2019-03-28 – 2019-03-29 (×2): 25 ug via INTRAVENOUS
  Filled 2019-03-28 (×2): qty 5

## 2019-03-28 MED ORDER — HEPARIN (PORCINE) 25000 UT/250ML-% IV SOLN
800.0000 [IU]/h | INTRAVENOUS | Status: DC
  Administered 2019-03-28: 08:00:00 800 [IU]/h via INTRAVENOUS
  Filled 2019-03-28: qty 250

## 2019-03-28 MED ORDER — INSULIN ASPART 100 UNIT/ML ~~LOC~~ SOLN
0.0000 [IU] | Freq: Three times a day (TID) | SUBCUTANEOUS | Status: DC
  Administered 2019-03-28: 17:00:00 1 [IU] via SUBCUTANEOUS
  Filled 2019-03-28: qty 1

## 2019-03-28 MED ORDER — INSULIN ASPART 100 UNIT/ML ~~LOC~~ SOLN: 0.0000 [IU] | Freq: Every day | SUBCUTANEOUS | Status: DC

## 2019-03-28 MED ORDER — HEPARIN (PORCINE) 25000 UT/250ML-% IV SOLN
650.0000 [IU]/h | INTRAVENOUS | Status: DC
  Administered 2019-03-28: 500 [IU]/h via INTRAVENOUS

## 2019-03-28 NOTE — Progress Notes (Signed)
ANTICOAGULATION CONSULT NOTE Pharmacy Consult for heparin drip management  Indication: Pulmonary Embolism  Allergies  Allergen Reactions  . Meloxicam Swelling    Patient Measurements: Height: 5\' 5"  (165.1 cm) Weight: 158 lb 14.4 oz (72.1 kg) IBW/kg (Calculated) : 57 Heparin Dosing Weight: 75.7kg   Vital Signs: Temp: 98.4 F (36.9 C) (01/17 1219) Temp Source: Oral (01/17 1219) BP: 111/54 (01/17 1219) Pulse Rate: 53 (01/17 1219)  Labs: Recent Labs    03/27/19 0121 03/28/19 0018 03/28/19 03/30/19 03/28/19 1306  HGB 13.1  --  11.6*  --   HCT 41.2  --  36.2  --   PLT 141*  --  146*  --   APTT 28  --   --   --   HEPARINUNFRC  --  1.96*  --  1.02*  CREATININE 1.15*  --  1.16*  --   TROPONINIHS 6  --   --   --     Estimated Creatinine Clearance: 33.3 mL/min (A) (by C-G formula based on SCr of 1.16 mg/dL (H)).   Medical History: Past Medical History:  Diagnosis Date  . Asthma   . Dementia (HCC)   . Hyperlipidemia   . Hypertension     Medications:  Scheduled:  . allopurinol  100 mg Oral Daily  . vitamin C  500 mg Oral Daily  . aspirin EC  81 mg Oral Daily  . cholecalciferol  1,000 Units Oral Daily  . citalopram  20 mg Oral Daily  . dexamethasone (DECADRON) injection  6 mg Intravenous Q24H  . donepezil  5 mg Oral QHS  . famotidine  20 mg Oral Daily  . furosemide  20 mg Oral Daily  . guaiFENesin  600 mg Oral BID  . insulin aspart  0-5 Units Subcutaneous QHS  . insulin aspart  0-9 Units Subcutaneous TID WC  . levothyroxine  25 mcg Intravenous Daily  . memantine  10 mg Oral BID  . pravastatin  40 mg Oral Daily  . zinc sulfate  220 mg Oral Daily   Infusions:  . sodium chloride 100 mL/hr at 03/28/19 1111  . remdesivir 100 mg in NS 100 mL 100 mg (03/28/19 1111)    Assessment: Pharmacy consulted for heparin drip management for 86 yo female admitted with COVID-19. Patient previously received enoxaparin 40mg  SQ, last dose at 0619. Angiography showing subacute or  chronic PE.   Goal of Therapy:  Heparin level 0.3-0.7 units/ml Monitor platelets by anticoagulation protocol: Yes   Plan:  01/17 @ 0300 HL 1.96 supratherapeutic. Instructed RN to hold drip for now and to restart at 0500 at a rate of 800 units/hr, and will recheck HL at 1300, per RN patient not having any complications bleeding, will continue to monitor.  1/17 @ 13:06 HL = 1.02.  Discontinued Heparin drip. Restart at 15:00 at decreased rate of 500 units/hr.  Recheck HL tonight at 23:00.  2/17, [facility] Clinical Pharmacist 03/28/2019,1:49 PM

## 2019-03-28 NOTE — Progress Notes (Signed)
ANTICOAGULATION CONSULT NOTE Pharmacy Consult for heparin drip management  Indication: Pulmonary Embolism  Allergies  Allergen Reactions  . Meloxicam Swelling    Patient Measurements: Height: 5\' 5"  (165.1 cm) Weight: 158 lb 14.4 oz (72.1 kg) IBW/kg (Calculated) : 57 Heparin Dosing Weight: 75.7kg   Vital Signs: Temp: 97.9 F (36.6 C) (01/16 2305) Temp Source: Oral (01/16 2305) BP: 111/63 (01/16 2305) Pulse Rate: 67 (01/16 2305)  Labs: Recent Labs    03/27/19 0121 03/28/19 0018  HGB 13.1  --   HCT 41.2  --   PLT 141*  --   APTT 28  --   HEPARINUNFRC  --  1.96*  CREATININE 1.15*  --   TROPONINIHS 6  --     Estimated Creatinine Clearance: 33.6 mL/min (A) (by C-G formula based on SCr of 1.15 mg/dL (H)).   Medical History: Past Medical History:  Diagnosis Date  . Asthma   . Dementia (HCC)   . Hyperlipidemia   . Hypertension     Medications:  Scheduled:  . allopurinol  100 mg Oral Daily  . vitamin C  500 mg Oral Daily  . aspirin EC  81 mg Oral Daily  . cholecalciferol  1,000 Units Oral Daily  . citalopram  20 mg Oral Daily  . dexamethasone (DECADRON) injection  6 mg Intravenous Q24H  . donepezil  5 mg Oral QHS  . famotidine  20 mg Oral Daily  . furosemide  20 mg Oral Daily  . guaiFENesin  600 mg Oral BID  . levothyroxine  50 mcg Oral BH-q7a  . memantine  10 mg Oral BID  . pravastatin  40 mg Oral Daily  . zinc sulfate  220 mg Oral Daily   Infusions:  . sodium chloride Stopped (03/27/19 0620)  . heparin    . remdesivir 100 mg in NS 100 mL      Assessment: Pharmacy consulted for heparin drip management for 86 yo female admitted with COVID-19. Patient previously received enoxaparin 40mg  SQ, last dose at 0619. Angiography showing subacute or chronic PE.   Goal of Therapy:  Heparin level 0.3-0.7 units/ml Monitor platelets by anticoagulation protocol: Yes   Plan:  01/17 @ 0300 HL 1.96 supratherapeutic. Instructed RN to hold drip for now and to restart  at 0500 at a rate of 800 units/hr, and will recheck HL at 1300, per RN patient not having any complications bleeding, will continue to monitor.  , PharmD, BCPS Clinical Pharmacist 03/28/2019,3:00 AM

## 2019-03-28 NOTE — Progress Notes (Signed)
PROGRESS NOTE    Stephanie Duarte  OAC:[id number] DOB: January 17, 1929 DOA: 03/27/2019 PCP: Medicine, [facility]      Brief Narrative:  Stephanie Duarte is a 86 y.o. F with dementia lives in [facility] memory care, asthma, and HTN who presented with hypoxia and respiratory distress for 1 day.    Diagnosed with COVID-19 few days PTA.  On day of admission, SpO2 86% and had increased work of breathing.    In the ER, SpO2 87% and RR 23.  COVID+.  CXR clear.  CTA showed nonocclusive right LL pulmonary artery.  Given Solu-medrol and bronchodilators for wheezing.  Started on IV remdesivir.          Assessment & Plan:  Covid pneumonitis with acute hypoxic respiratory failure Patient presented with hypoxia, COVID+ and respiratory distress in the setting of hte ongoing 2020-2021 COVID-19 pandemic.  Started on remdesivir and steroids at admission. -Continue remdesivir, day 2 -Continue dexamethasone, day2  -Start zinc, vitamin C when able to take p.o. -PT and SLP eval -Nutrition consult   Delirium, inability to swallow Patient has dementia baseline, was very confused overnight and agitated.  Nursing overnight had concerns about patient's ability to swallow, she coughed numerous times on any oral intake. -SLP evaluation -Transition levothyroxine to IV -Hold oral medications  Pulmonary embolism See MDM from yesterday. -Continue heparin until able to take PO, then transition to Eliquis  Hypothyroidism -Continue levothyroxine transition to IV  Diabetes -Hold metformin -Start SS corrections as needed  Dementia with acute metabolic encephalopathy Per daughter by phone, at baseline patient is wheelchair bound, nonambulatory.  Has had decline in cognition in last year due to social isolation, now sometimes does not recognize her. -Resume donepezil, citalopram, memantine when able to take PO  Asthma without exacerbation -Continue albuterol PRN  Hypertension History TIA, CV disease  secondary prevention -Resume furosemide, aspirin, and pravastatin when able to take p.o.  Gout -Resume allopurinol when able to take p.o.  GERD -Resume Pepcid when able to take p.o.   CKD IIIb Baseline Cr 1.1, and stable without change today.           Disposition: Patient was admitted with coronavirus, and new PE.  She is quite agitated and confused, but her hypoxia is completely resolved.  We will get a speech evaluation, and if she is able to take oral intake consistently for the next 24 to 48 hours, will likely discharge back to SNF    Culture data NOne   Antimicrobials Remdesivir 1/16 >>  Procedures 1/16 CXR -- clear 1/16 CTA chest -- no pneumonia, but RLL pulmonary artery filling defect, suspect nonocclusive clot  Consultants None     DVT prophylaxis: N/A, patient has VTE right now, on therapeutic heparin Code Status: DNR Family Communication:  MDM:  The below labs and imaging reports reviewed and summarized above.  Medication management as above, including anticoagulation.       SUbjective: Patient is incoherent.  Nursing report no new fever, respiratory distress.  They report she is coughing with any oral intake overnight, and disoriented, but no seizure, passing out, respiratory distress.  Objective: Vitals:   03/27/19 1930 03/27/19 2000 03/27/19 2305 03/28/19 0549  BP: (!) 113/50 115/78 111/63 95/83  Pulse: (!) 57 (!) 58 67 61  Resp:   (!) 24 20  Temp:   97.9 F (36.6 C) 98.6 F (37 C)  TempSrc:   Oral Oral  SpO2: 94% 93% 96% 100%  Weight:   72.1 kg  Height:        Intake/Output Summary (Last 24 hours) at 03/28/2019 0806 Last data filed at 03/28/2019 0549 Gross per 24 hour  Intake 180.55 ml  Output 100 ml  Net 80.55 ml   Filed Weights   03/27/19 0052 03/27/19 2305  Weight: 86.2 kg 72.1 kg    Examination: General appearance: Elderly adult female,, restless, incoherent.  In no obvious respiratory distress.   HEENT: Anicteric,  conjunctiva pink, lids and lashes normal. No nasal deformity, discharge, epistaxis.  Lips moist, teeth normal. OP tacky dry, no oral lesions.   Skin: Warm and dry.  No suspicious rashes or lesions. Cardiac: RRR, no murmurs appreciated.  No LE edema.    Respiratory: Tachypneic, does not seem in respiratory distress.  Lungs clear without rales or wheezes, diminished bilaterally  Abdomen: Abdomen soft.  No grimace to palpation or guarding. No ascites, distension, hepatosplenomegaly.   MSK: No deformities or effusions of the large joints of the upper or lower extremities bilaterally. Neuro: Awake Contact, but disoriented, incoherent.  Appears to move upper extremities normal strength and coordination, otherwise does not really follow commands.  Speech seems fluent.    Psych: Not oriented to person, place, or time.  Attention diminished.      Data Reviewed: I have personally reviewed following labs and imaging studies:  CBC: Recent Labs  Lab 03/27/19 0121  WBC 4.9  NEUTROABS 1.9  HGB 13.1  HCT 41.2  MCV 96.5  PLT 269*   Basic Metabolic Panel: Recent Labs  Lab 03/27/19 0121  NA 138  K 4.6  CL 98  CO2 30  GLUCOSE 124*  BUN 22  CREATININE 1.15*  CALCIUM 9.0   GFR: Estimated Creatinine Clearance: 33.6 mL/min (A) (by C-G formula based on SCr of 1.15 mg/dL (H)). Liver Function Tests: Recent Labs  Lab 03/27/19 0121  AST 36  ALT 41  ALKPHOS 78  BILITOT 0.7  PROT 6.6  ALBUMIN 3.7   No results for input(s): LIPASE, AMYLASE in the last 168 hours. No results for input(s): AMMONIA in the last 168 hours. Coagulation Profile: No results for input(s): INR, PROTIME in the last 168 hours. Cardiac Enzymes: No results for input(s): CKTOTAL, CKMB, CKMBINDEX, TROPONINI in the last 168 hours. BNP (last 3 results) No results for input(s): PROBNP in the last 8760 hours. HbA1C: No results for input(s): HGBA1C in the last 72 hours. CBG: No results for input(s): GLUCAP in the last 168  hours. Lipid Profile: No results for input(s): CHOL, HDL, LDLCALC, TRIG, CHOLHDL, LDLDIRECT in the last 72 hours. Thyroid Function Tests: No results for input(s): TSH, T4TOTAL, FREET4, T3FREE, THYROIDAB in the last 72 hours. Anemia Panel: No results for input(s): VITAMINB12, FOLATE, FERRITIN, TIBC, IRON, RETICCTPCT in the last 72 hours. Urine analysis:    Component Value Date/Time   COLORURINE STRAW (A) 03/22/2017 1251   APPEARANCEUR CLEAR (A) 03/22/2017 1251   LABSPEC 1.010 03/22/2017 1251   PHURINE 5.0 03/22/2017 1251   GLUCOSEU NEGATIVE 03/22/2017 1251   HGBUR NEGATIVE 03/22/2017 1251   BILIRUBINUR NEGATIVE 03/22/2017 1251   KETONESUR NEGATIVE 03/22/2017 1251   PROTEINUR NEGATIVE 03/22/2017 1251   NITRITE NEGATIVE 03/22/2017 1251   LEUKOCYTESUR NEGATIVE 03/22/2017 1251   Sepsis Labs: @LABRCNTIP (procalcitonin:4,lacticacidven:4)  )No results found for this or any previous visit (from the past 240 hour(s)).       Radiology Studies: CT Angio Chest PE W and/or Wo Contrast  Result Date: 03/27/2019 CLINICAL DATA:  COVID positive.  Short of breath. EXAM:  CT ANGIOGRAPHY CHEST WITH CONTRAST TECHNIQUE: Multidetector CT imaging of the chest was performed using the standard protocol during bolus administration of intravenous contrast. Multiplanar CT image reconstructions and MIPs were obtained to evaluate the vascular anatomy. CONTRAST:  10mL OMNIPAQUE IOHEXOL 350 MG/ML SOLN COMPARISON:  None. FINDINGS: Cardiovascular: There is wall adherent clot within the RIGHT lower lobe pulmonary artery (image 49/4 and image 51/4). These are nonocclusive. There is respiratory image degradation which limits evaluation of the more distal lower lobe pulmonary arteries. Mediastinum/Nodes: No axillary supraclavicular adenopathy. Enlarged thyroid nodule on the RIGHT is not changed from 2017 measuring 2.5 cm short axis. No mediastinal lymphadenopathy. Lungs/Pleura: No airspace disease. No pulmonary infarction.  No pleural fluid. Mild basilar atelectasis. Upper Abdomen: Low-density lesions in liver consistent benign cysts. Small hiatal hernia. Musculoskeletal: No aggressive osseous lesion. Degenerative osteophytosis of the spine. Review of the MIP images confirms the above findings. IMPRESSION: 1. Small volume wall adherent clot within the RIGHT lower lobe pulmonary arteries is most consistent with subacute/chronic pulmonary embolism. These small clots are nonocclusive. No clear evidence of acute pulmonary embolism. 2. No pulmonary infarction. 3. No evidence of pneumonia. 4. Coronary artery calcification and Aortic Atherosclerosis (ICD10-I70.0). Findings conveyed toCAROLINA Duarte on 03/27/2019  at07:18. Electronically Signed   By: Genevive Bi M.D.   On: 03/27/2019 07:18   DG Chest Portable 1 View  Result Date: 03/27/2019 CLINICAL DATA:  Cough and hypoxia, positive COVID-19 test EXAM: PORTABLE CHEST 1 VIEW COMPARISON:  04/11/2018 FINDINGS: Cardiac shadow is stable. Aortic calcifications are again seen. The lungs are well aerated bilaterally. Mild scarring is again seen in the left base. No bony abnormality is seen. IMPRESSION: No acute abnormality noted. Electronically Signed   By: Alcide Clever M.D.   On: 03/27/2019 01:39        Scheduled Meds: . allopurinol  100 mg Oral Daily  . vitamin C  500 mg Oral Daily  . aspirin EC  81 mg Oral Daily  . cholecalciferol  1,000 Units Oral Daily  . citalopram  20 mg Oral Daily  . dexamethasone (DECADRON) injection  6 mg Intravenous Q24H  . donepezil  5 mg Oral QHS  . famotidine  20 mg Oral Daily  . furosemide  20 mg Oral Daily  . guaiFENesin  600 mg Oral BID  . levothyroxine  50 mcg Oral BH-q7a  . memantine  10 mg Oral BID  . pravastatin  40 mg Oral Daily  . zinc sulfate  220 mg Oral Daily   Continuous Infusions: . sodium chloride Stopped (03/27/19 0620)  . heparin 800 Units/hr (03/28/19 0756)  . remdesivir 100 mg in NS 100 mL       LOS: 1 day     Time spent: 35 minutes    Alberteen Sam, MD [facility] 03/28/2019, 8:06 AM     Please page though AMION or Epic secure chat:  For password, contact charge nurse

## 2019-03-29 LAB — COMPREHENSIVE METABOLIC PANEL
ALT: 32 U/L (ref 0–44)
AST: 43 U/L — ABNORMAL HIGH (ref 15–41)
Albumin: 3 g/dL — ABNORMAL LOW (ref 3.5–5.0)
Alkaline Phosphatase: 57 U/L (ref 38–126)
Anion gap: 8 (ref 5–15)
BUN: 24 mg/dL — ABNORMAL HIGH (ref 8–23)
CO2: 26 mmol/L (ref 22–32)
Calcium: 8.2 mg/dL — ABNORMAL LOW (ref 8.9–10.3)
Chloride: 107 mmol/L (ref 98–111)
Creatinine, Ser: 0.98 mg/dL (ref 0.44–1.00)
GFR calc Af Amer: 60 mL/min — ABNORMAL LOW (ref >60)
GFR calc non Af Amer: 52 mL/min — ABNORMAL LOW (ref >60)
Glucose, Bld: 98 mg/dL (ref 70–99)
Potassium: 3.9 mmol/L (ref 3.5–5.1)
Sodium: 141 mmol/L (ref 135–145)
Total Bilirubin: 0.7 mg/dL (ref 0.3–1.2)
Total Protein: 5.6 g/dL — ABNORMAL LOW (ref 6.5–8.1)

## 2019-03-29 LAB — CBC WITH DIFFERENTIAL/PLATELET
Abs Immature Granulocytes: 0.04 10*3/uL (ref 0.00–0.07)
Basophils Absolute: 0 10*3/uL (ref 0.0–0.1)
Basophils Relative: 0 %
Eosinophils Absolute: 0 10*3/uL (ref 0.0–0.5)
Eosinophils Relative: 0 %
HCT: 34.1 % — ABNORMAL LOW (ref 36.0–46.0)
Hemoglobin: 11 g/dL — ABNORMAL LOW (ref 12.0–15.0)
Immature Granulocytes: 1 %
Lymphocytes Relative: 24 %
Lymphs Abs: 1.6 10*3/uL (ref 0.7–4.0)
MCH: 30.9 pg (ref 26.0–34.0)
MCHC: 32.3 g/dL (ref 30.0–36.0)
MCV: 95.8 fL (ref 80.0–100.0)
Monocytes Absolute: 0.3 10*3/uL (ref 0.1–1.0)
Monocytes Relative: 5 %
Neutro Abs: 4.7 10*3/uL (ref 1.7–7.7)
Neutrophils Relative %: 70 %
Platelets: 144 10*3/uL — ABNORMAL LOW (ref 150–400)
RBC: 3.56 MIL/uL — ABNORMAL LOW (ref 3.87–5.11)
RDW: 14 % (ref 11.5–15.5)
WBC: 6.7 10*3/uL (ref 4.0–10.5)
nRBC: 0 % (ref 0.0–0.2)

## 2019-03-29 LAB — FIBRIN DERIVATIVES D-DIMER (ARMC ONLY): Fibrin derivatives D-dimer (ARMC): 722.84 ng/mL (FEU) — ABNORMAL HIGH (ref 0.00–499.00)

## 2019-03-29 LAB — GLUCOSE, CAPILLARY
Glucose-Capillary: 74 mg/dL (ref 70–99)
Glucose-Capillary: 92 mg/dL (ref 70–99)
Glucose-Capillary: 124 mg/dL — ABNORMAL HIGH (ref 70–99)

## 2019-03-29 LAB — HEPARIN LEVEL (UNFRACTIONATED): Heparin Unfractionated: 0.29 IU/mL — ABNORMAL LOW (ref 0.30–0.70)

## 2019-03-29 LAB — FERRITIN: Ferritin: 203 ng/mL (ref 11–307)

## 2019-03-29 LAB — C-REACTIVE PROTEIN: CRP: 0.5 mg/dL (ref <1.0)

## 2019-03-29 MED ORDER — HALOPERIDOL LACTATE 2 MG/ML PO CONC
0.5000 mg | ORAL | Status: DC | PRN
  Filled 2019-03-29: qty 0.3

## 2019-03-29 MED ORDER — APIXABAN 5 MG PO TABS: 5.0000 mg | ORAL_TABLET | Freq: Two times a day (BID) | ORAL | Status: DC

## 2019-03-29 MED ORDER — LORAZEPAM 1 MG PO TABS: 1.0000 mg | ORAL_TABLET | ORAL | Status: DC | PRN

## 2019-03-29 MED ORDER — HALOPERIDOL LACTATE 5 MG/ML IJ SOLN: 0.5000 mg | INTRAMUSCULAR | Status: DC | PRN

## 2019-03-29 MED ORDER — HALOPERIDOL 0.5 MG PO TABS
0.5000 mg | ORAL_TABLET | ORAL | Status: DC | PRN
  Filled 2019-03-29: qty 1

## 2019-03-29 MED ORDER — MORPHINE SULFATE (CONCENTRATE) 10 MG/0.5ML PO SOLN: 5.0000 mg | ORAL | Status: DC | PRN

## 2019-03-29 MED ORDER — ENSURE ENLIVE PO LIQD: 237.0000 mL | Freq: Three times a day (TID) | ORAL | Status: DC

## 2019-03-29 MED ORDER — LORAZEPAM 2 MG/ML IJ SOLN: 1.0000 mg | INTRAMUSCULAR | Status: DC | PRN

## 2019-03-29 MED ORDER — BIOTENE DRY MOUTH MT LIQD: 15.0000 mL | OROMUCOSAL | Status: DC | PRN

## 2019-03-29 MED ORDER — LORAZEPAM 2 MG/ML PO CONC
1.0000 mg | ORAL | Status: DC | PRN
  Filled 2019-03-29: qty 0.5

## 2019-03-29 MED ORDER — APIXABAN 5 MG PO TABS
10.0000 mg | ORAL_TABLET | Freq: Two times a day (BID) | ORAL | Status: DC
  Administered 2019-03-29: 18:00:00 10 mg via ORAL
  Filled 2019-03-29: qty 2

## 2019-03-29 MED ORDER — ADULT MULTIVITAMIN W/MINERALS CH: 1.0000 | ORAL_TABLET | Freq: Every day | ORAL | Status: DC

## 2019-03-29 NOTE — TOC Initial Note (Signed)
Transition of Care [facility]) - Initial/Assessment Note    Patient Details  Name: Stephanie Duarte MRN: [id number] Date of Birth: 01-07-1929  [facility]) CM/SW Contact:    Chapman Fitch, RN Phone Number: 03/29/2019, 1:33 PM  Clinical Narrative:                 Patient from [facility] MD to discuss with family about transition to Hospice.   RNCM awaiting disposition         Patient Goals and CMS Choice        Expected Discharge Plan and Services                                                Prior Living Arrangements/Services                       Activities of Daily Living Home Assistive Devices/Equipment: None ADL Screening (condition at time of admission) Patient's cognitive ability adequate to safely complete daily activities?: No Is the patient deaf or have difficulty hearing?: No Does the patient have difficulty seeing, even when wearing glasses/contacts?: No Does the patient have difficulty concentrating, remembering, or making decisions?: Yes Patient able to express need for assistance with ADLs?: No Does the patient have difficulty dressing or bathing?: Yes Independently performs ADLs?: No Communication: Independent Dressing (OT): Dependent Is this a change from baseline?: Pre-admission baseline Grooming: Dependent Is this a change from baseline?: Pre-admission baseline Feeding: Dependent Is this a change from baseline?: Pre-admission baseline Bathing: Dependent Is this a change from baseline?: Pre-admission baseline Toileting: Dependent Is this a change from baseline?: Pre-admission baseline In/Out Bed: Dependent Is this a change from baseline?: Pre-admission baseline Walks in Home: Dependent Is this a change from baseline?: Pre-admission baseline Does the patient have difficulty walking or climbing stairs?: Yes Weakness of Legs: Both Weakness of Arms/Hands: Both  Permission Sought/Granted                 Emotional Assessment              Admission diagnosis:  Acute respiratory failure with hypoxia (HCC) [J96.01] COVID-19 [U07.1] Patient Active Problem List   Diagnosis Date Noted  . COVID-19 03/27/2019  . DNR (do not resuscitate) 03/27/2019  . Asthma exacerbation 04/11/2018  . Pneumonia 03/12/2018  . Fever 07/04/2015   PCP:  Marisue Ivan, MD Pharmacy:  No Pharmacies Listed    Social Determinants of Health (SDOH) Interventions    Readmission Risk Interventions No flowsheet data found.

## 2019-03-29 NOTE — Progress Notes (Signed)
PT Cancellation Note  Patient Details Name: Stephanie Duarte MRN: [id number] DOB: 1929/02/18   Cancelled Treatment:    Reason Eval/Treat Not Completed: Medical issues which prohibited therapy(Pt being followed by pharmacy for acute PE. Per facility policy, pt should undergo 48hrs therapeutic anticoagulation prior to working with PT. Will hold services this date, resume at later date/time once appropriate.)  9:11 AM, 03/29/19 Rosamaria Lints, PT, DPT Physical Therapist - [facility]  [phone] (ASCOM)    Amiir Heckard C 03/29/2019, 9:11 AM

## 2019-03-29 NOTE — Progress Notes (Signed)
ANTICOAGULATION CONSULT NOTE Pharmacy Consult for heparin drip management  Indication: Pulmonary Embolism  Allergies  Allergen Reactions  . Meloxicam Swelling    Patient Measurements: Height: 5\' 5"  (165.1 cm) Weight: 158 lb 14.4 oz (72.1 kg) IBW/kg (Calculated) : 57 Heparin Dosing Weight: 75.7kg   Vital Signs: Temp: 97.8 F (36.6 C) (01/17 2023) Temp Source: Oral (01/17 2023) BP: 126/89 (01/17 2023) Pulse Rate: 58 (01/17 2059)  Labs: Recent Labs    03/27/19 0121 03/28/19 0018 03/28/19 0632 03/28/19 1306 03/28/19 2310  HGB 13.1  --  11.6*  --   --   HCT 41.2  --  36.2  --   --   PLT 141*  --  146*  --   --   APTT 28  --   --   --   --   HEPARINUNFRC  --  1.96*  --  1.02* 0.48  CREATININE 1.15*  --  1.16*  --   --   TROPONINIHS 6  --   --   --   --     Estimated Creatinine Clearance: 33.3 mL/min (A) (by C-G formula based on SCr of 1.16 mg/dL (H)).   Medical History: Past Medical History:  Diagnosis Date  . Asthma   . Dementia (HCC)   . Hyperlipidemia   . Hypertension     Medications:  Scheduled:  . allopurinol  100 mg Oral Daily  . vitamin C  500 mg Oral Daily  . aspirin EC  81 mg Oral Daily  . cholecalciferol  1,000 Units Oral Daily  . citalopram  20 mg Oral Daily  . dexamethasone (DECADRON) injection  6 mg Intravenous Q24H  . donepezil  5 mg Oral QHS  . famotidine  20 mg Oral Daily  . furosemide  20 mg Oral Daily  . guaiFENesin  600 mg Oral BID  . insulin aspart  0-5 Units Subcutaneous QHS  . insulin aspart  0-9 Units Subcutaneous TID WC  . levothyroxine  25 mcg Intravenous Daily  . memantine  10 mg Oral BID  . pravastatin  40 mg Oral Daily  . zinc sulfate  220 mg Oral Daily   Infusions:  . sodium chloride 100 mL/hr at 03/28/19 2323  . heparin 500 Units/hr (03/28/19 1555)  . remdesivir 100 mg in NS 100 mL Stopped (03/28/19 1201)    Assessment: Pharmacy consulted for heparin drip management for 86 yo female admitted with COVID-19. Patient  previously received enoxaparin 40mg  SQ, last dose at 0619. Angiography showing subacute or chronic PE.   Goal of Therapy:  Heparin level 0.3-0.7 units/ml Monitor platelets by anticoagulation protocol: Yes   Plan:  01/17 @ 2300 HL 0.48 therapeutic. Will continue rate of 500 units/hr and will recheck HL at 0700, will continue to monitor.  , [facility] Clinical Pharmacist 03/29/2019,1:54 AM

## 2019-03-29 NOTE — Progress Notes (Signed)
Patient's daughter called for update. Update given.   Madie Reno, RN

## 2019-03-29 NOTE — Evaluation (Signed)
Clinical/Bedside Swallow Evaluation Patient Details  Name: Stephanie Duarte MRN: [id number] Date of Birth: Aug 05, 1928  Today's Date: 03/29/2019 Time: SLP Start Time (ACUTE ONLY): 0950 SLP Stop Time (ACUTE ONLY): 1040 SLP Time Calculation (min) (ACUTE ONLY): 50 min  Past Medical History:  Past Medical History:  Diagnosis Date  . Asthma   . Dementia (HCC)   . Hyperlipidemia   . Hypertension    Past Surgical History: History reviewed. No pertinent surgical history. HPI:  Pt is a 86 y.o. female with a history of asthma, advanced Alzheimer's Dementia, hypertension, hyperlipidemia  who presents for evaluation of hypoxia and difficulty breathing.  According to the nursing home, patient was recently diagnosed with Covid.  Per NSG report, pt has not been taking much po d/t refusal/confusion.  CT Angio of the Chest revealed: "Small volume wall adherent clot within the RIGHT lower lobe most consistent with subacute/chronic PE"; "No evidence of pneumonia".  Previous CXR was last year in 04/2018 which revealed "No active disease" then.    Assessment / Plan / Recommendation Clinical Impression  Pt appears to present w/ declined Cognitive status (baseline Advanced Dementia per chart notes) which significantly impacts her willingness and awareness for oral intake. Unsure if related to acute illness; noted pt's BMI and No recent CXR imagining indicating chronic dysphagia, aspiration issues. Pt presented w/ constant verbal output, tangential in nature. She did not follow instructions readily given verbal/tactile/visual cues - it almost seemed to agitate her more the more cues were given. With much encouragement, pt accepted and held Cup to drink few sips of thin liquids via straw, then 1 trial a puree, and fed self 1 graham cracker. No overt clinical s/s of aspiration were noted during/post trials; no coughing, no decline in vocal quality or respiratory status. Oral phase appeared grossly [facility] though min increased  munching on the cracker and less rotary mastication noted. Oral clearing achieved given time. Attempted further sips of thin liquids, however, pt refused. OM exam was cursory; No unilateral weakness noted and bolus management was [facility]. Pt could help to feed self but was too confused to readily participate in tasks consistently. Unsure of pt's baseline status prior to hospitalization. Possible improvement could occur if confusion could lessen.  Recommend continue current dysphagia level 2 (minced foods) w/ thin liquids diet; general aspiration precautions; Pills Crushed in Puree for safer swallowing/intake d/t Confusion; 100% Supervision at meals d/t Confusion. Recommend ST services f/u at next venue of care b/f any diet (food) consistency upgrade to ensure appropriateness/safety w/ upgrade. NSG and MD consulted re: results today. Precautions posted.  SLP Visit Diagnosis: Dysphagia, unspecified (R13.10)(Cognitive decline/Dementia)    Aspiration Risk  Mild aspiration risk;Risk for inadequate nutrition/hydration(reduced when following general precs)    Diet Recommendation  Dysphagia level 2 (MINCED foods w/ gravies); Thin liquids. General aspiration precautions; 100% Supervision w/ all oral intake/meals d/t declined Cognitive status  Medication Administration: Crushed with puree(for safer swallowing)    Other  Recommendations Recommended Consults: (Dietician f/u; Palliative Care f/u) Oral Care Recommendations: Oral care BID;Oral care before and after PO;Staff/trained caregiver to provide oral care Other Recommendations: (n/a)   Follow up Recommendations (TBD)      Frequency and Duration (n/a)  (n/a)       Prognosis Prognosis for Safe Diet Advancement: Fair Barriers to Reach Goals: Cognitive deficits;Time post onset;Severity of deficits;Behavior      Swallow Study   General Date of Onset: 03/27/19 HPI: Pt is a 86 y.o. female with a history of  asthma, advanced Alzheimer's Dementia,  hypertension, hyperlipidemia  who presents for evaluation of hypoxia and difficulty breathing.  According to the nursing home, patient was recently diagnosed with Covid.  Per NSG report, pt has not been taking much po d/t refusal/confusion.  CT Angio of the Chest revealed: "Small volume wall adherent clot within the RIGHT lower lobe most consistent with subacute/chronic PE"; "No evidence of pneumonia".  Previous CXR was last year in 04/2018 which revealed "No active disease" then.  Type of Study: Bedside Swallow Evaluation Previous Swallow Assessment: none  Diet Prior to this Study: Dysphagia 2 (chopped);Thin liquids Temperature Spikes Noted: No(wbc 6.7) Respiratory Status: Room air History of Recent Intubation: No Behavior/Cognition: Alert;Confused;Agitated;Uncooperative;Distractible;Requires cueing Oral Cavity Assessment: Dry(min) Oral Care Completed by SLP: Yes(attempted w/ pt's participation) Oral Cavity - Dentition: Adequate natural dentition Vision: Functional for self-feeding Self-Feeding Abilities: Able to feed self;Needs assist;Needs set up;Total assist(encouragement) Patient Positioning: Upright in bed(needed full positioning) Baseline Vocal Quality: Normal Volitional Cough: Cognitively unable to elicit Volitional Swallow: Unable to elicit    Oral/Motor/Sensory Function Overall Oral Motor/Sensory Function: Within functional limits(no unilateral weakness appreciated)   Ice Chips Ice chips: Not tested Other Comments: pt refused   Thin Liquid Thin Liquid: Within functional limits Presentation: Self Fed;Straw(helped to hold cup - 3 trials total accepted) Other Comments: much encouragement needed    Nectar Thick Nectar Thick Liquid: Not tested   Honey Thick Honey Thick Liquid: Not tested   Puree Puree: Within functional limits Presentation: Spoon(fed; 1 trial accepted)   Solid     Solid: Within functional limits Presentation: Self Fed(1 graham cracker) Other Comments: pt held  the cracker and fed self munching on the cracker - less rotary mastication noted. Oral clearing achieved.       Stephanie Kenner, MS, CCC-SLP Stephanie Duarte 03/29/2019,3:10 PM

## 2019-03-29 NOTE — Progress Notes (Signed)
Initial Nutrition Assessment  DOCUMENTATION CODES:   Not applicable  INTERVENTION:   Ensure Enlive po TID, each supplement provides 350 kcal and 20 grams of protein  Magic cup TID with meals, each supplement provides 290 kcal and 9 grams of protein  MVI daily   NUTRITION DIAGNOSIS:   Increased nutrient needs related to catabolic illness(COVID 19) as evidenced by increased estimated needs  GOAL:   Patient will meet greater than or equal to 90% of their needs  MONITOR:   PO intake, Supplement acceptance, Weight trends, Labs, Skin, I & O's  REASON FOR ASSESSMENT:   Consult Assessment of nutrition requirement/status  ASSESSMENT:   86 y.o. female with a history of asthma, Alzheimer's dementia, hypertension, hyperlipidemia  who presents for evaluation of hypoxia and difficulty breathing and found to have COVID 19 and PE   Unable to speak with patient r/t dementia. Pt with increased estimated needs r/t COVID 19. Per chart, pt is refusing meals. RD will add supplements and vitamins to help pt meet her estimated needs. Per chart, pt appears fairly weight stable at baseline; however, there are no recent weights documented in chart.   Medications reviewed and include: allopurinol, vitamin C, aspirin, D3, celexa, dexamethasone, pepcid, lasix, insulin, synthroid, zinc, NaCl @100ml /hr, heparin    Labs reviewed: BUN 24(H)  Unable to complete Nutrition-Focused physical exam at this time as pt with COVID 19.   Diet Order:   Diet Order            DIET DYS 2 Room service appropriate? Yes with Assist; Fluid consistency: Thin  Diet effective now             EDUCATION NEEDS:   Not appropriate for education at this time  Skin:  Skin Assessment: Reviewed RN Assessment(ecchymosis)  Last BM:  PTA  Height:   Ht Readings from Last 1 Encounters:  03/27/19 5\' 5"  (1.651 m)    Weight:   Wt Readings from Last 1 Encounters:  03/27/19 72.1 kg    Ideal Body Weight:  56.8  kg  BMI:  Body mass index is 26.44 kg/m.  Estimated Nutritional Needs:   Kcal:  1600-1800kcal/day  Protein:  80-90g/day  Fluid:  >1.4L/day  MS, RD, LDN Pager #- [phone] Office#- [phone] After Hours Pager: [phone]

## 2019-03-29 NOTE — Progress Notes (Signed)
PROGRESS NOTE    Stephanie Duarte  ONG:[id number] DOB: 01/13/1929 DOA: 03/27/2019 PCP: Marisue Ivan, MD      Brief Narrative:  Stephanie Duarte is a 86 y.o. F with dementia lives in [facility] memory care, asthma, and HTN who presented with hypoxia and respiratory distress for 1 day.    Diagnosed with COVID-19 few days PTA.  On day of admission, SpO2 86% and had increased work of breathing.    In the ER, SpO2 87% and RR 23.  COVID+.  CXR clear.  CTA showed nonocclusive right LL pulmonary artery.  Given Solu-medrol and bronchodilators for wheezing.  Started on IV remdesivir.          Assessment & Plan:  Covid pneumonitis with acute hypoxic respiratory failure Patient presented with hypoxia, COVID+ and respiratory distress in the setting of hte ongoing 2020-2021 COVID-19 pandemic.  Started on remdesivir and steroids at admission.  Nutrition were consulted as well as PT.  However the patient remained severely delirious and encephalopathic.  She refuses food, and is unable to keep up with her fluid and nutritional needs.  In my medical experience remdesivir and steroids have no observed benefit in treating Covid encephalopathy, and there is no report of section literature.  Further, the patient's hypoxia and Covid pneumonitis has resolved already.  -Discontinue remdesivir and steroids  Given her advanced age, advanced dementia, limited functional status at baseline, her prognosis has gotten progressively worse.  Given that she is unable to obtain any nutrition fluid intake by mouth, and has demonstrated for several days, worsening mentation, I suspect that this is an irreversible condition.  Family and I discussed goals of care, and a feeding tube and prolonging her life by artificial means was explicitly against the patient's wishes.  In light of this, I recommended, and family feel in agreement, that hospice is warranted, to allow the patient a natural death, with comfort and  dignity.    Delirium, inability to swallow  Pulmonary embolism -Discontinue anticoagulant -Comfort measures  Hypothyroidism -Discontinue levothyroxine  Diabetes -Discontinue Accu-Cheks  Dementia with acute metabolic encephalopathy Per daughter by phone, at baseline patient is wheelchair bound, nonambulatory.  Has had decline in cognition in last year due to social isolation, now sometimes does not recognize her.  Asthma without exacerbation -Continue albuterol PRN  Hypertension History TIA, CV disease secondary prevention  Gout  GERD   CKD IIIb           Disposition: Patient was admitted with coronavirus, and new PE.    Her cognitive status has steadily declined, she has consistently refused food and drink, and based on her expressed wishes prior to onset of dementia, the patient would not have wanted heroic measures nor her life extended by artificial means.    We will discontinue the above treatments, and pursue comfort measures.  We will explore hospice placement options, as we anticipate an imminent death within days to weeks.     DVT prophylaxis: Comfort measures Code Status: DNR Family Communication: Daughter by phone MDM:  The below labs and imaging reports reviewed and summarized above.  Medication management as above.         SUbjective: The patient remains incoherent, unable to eat, and refusing food.  No no fever.  She is agitated, and uncomfortable.     Objective: Vitals:   03/28/19 2023 03/28/19 2059 03/29/19 0431 03/29/19 1249  BP: 126/89  113/62 115/66  Pulse: 65 (!) 58 (!) 55 (!) 52  Resp: 20  20 18  Temp: 97.8 F (36.6 C)  98.3 F (36.8 C) 98.2 F (36.8 C)  TempSrc: Oral  Oral Axillary  SpO2: (!) 81% 96% 97% 98%  Weight:      Height:        Intake/Output Summary (Last 24 hours) at 03/29/2019 1857 Last data filed at 03/29/2019 1831 Gross per 24 hour  Intake 2572.1 ml  Output 200 ml  Net 2372.1 ml   Filed Weights    03/27/19 0052 03/27/19 2305  Weight: 86.2 kg 72.1 kg    Examination: General appearance: Elderly adult female, awake, but inattentive, sometimes makes eye contact, overall seems agitated, and does not follow commands HEENT: Anicteric, conjunctiva pink, lids and lashes normal. No nasal deformity, discharge, epistaxis.  Lips dry, oropharynx dry, no oral lesions, hearing seems normal.  Skin: Dry and tented Cardiac: Tachycardic, no murmurs, no lower extremity edema Respiratory: Seems tachypneic, due to agitation not respiratory distress.  No accessory muscle use, lung sounds diminished, no rales or wheezes. Abdomen: Abdomen soft, no tenderness palpation, no ascites or distention MSK: No deformities or effusions of the large joints of the upper or lower extremities bilaterally. Neuro: Awake but does not follow commands, speaks incoherently.  Does not make eye contact consistently. Psych: Unable to assess     Data Reviewed: I have personally reviewed following labs and imaging studies:  CBC: Recent Labs  Lab 03/27/19 0121 03/28/19 0632 03/29/19 0653  WBC 4.9 7.3 6.7  NEUTROABS 1.9 4.8 4.7  HGB 13.1 11.6* 11.0*  HCT 41.2 36.2 34.1*  MCV 96.5 95.8 95.8  PLT 141* 146* 076*   Basic Metabolic Panel: Recent Labs  Lab 03/27/19 0121 03/28/19 0632 03/29/19 0653  NA 138 139 141  K 4.6 4.1 3.9  CL 98 102 107  CO2 30 28 26   GLUCOSE 124* 108* 98  BUN 22 24* 24*  CREATININE 1.15* 1.16* 0.98  CALCIUM 9.0 8.6* 8.2*   GFR: Estimated Creatinine Clearance: 39.5 mL/min (by C-G formula based on SCr of 0.98 mg/dL). Liver Function Tests: Recent Labs  Lab 03/27/19 0121 03/28/19 0632 03/29/19 0653  AST 36 27 43*  ALT 41 28 32  ALKPHOS 78 62 57  BILITOT 0.7 0.7 0.7  PROT 6.6 5.9* 5.6*  ALBUMIN 3.7 3.1* 3.0*   No results for input(s): LIPASE, AMYLASE in the last 168 hours. No results for input(s): AMMONIA in the last 168 hours. Coagulation Profile: No results for input(s): INR,  PROTIME in the last 168 hours. Cardiac Enzymes: No results for input(s): CKTOTAL, CKMB, CKMBINDEX, TROPONINI in the last 168 hours. BNP (last 3 results) No results for input(s): PROBNP in the last 8760 hours. HbA1C: Recent Labs    03/28/19 0632  HGBA1C 6.9*   CBG: Recent Labs  Lab 03/28/19 1636 03/28/19 2153 03/29/19 0917 03/29/19 1205 03/29/19 1716  GLUCAP 134* 145* 74 92 124*   Lipid Profile: No results for input(s): CHOL, HDL, LDLCALC, TRIG, CHOLHDL, LDLDIRECT in the last 72 hours. Thyroid Function Tests: No results for input(s): TSH, T4TOTAL, FREET4, T3FREE, THYROIDAB in the last 72 hours. Anemia Panel: Recent Labs    03/28/19 0632 03/29/19 0653  FERRITIN 224 203   Urine analysis:    Component Value Date/Time   COLORURINE STRAW (A) 03/22/2017 1251   APPEARANCEUR CLEAR (A) 03/22/2017 1251   LABSPEC 1.010 03/22/2017 1251   PHURINE 5.0 03/22/2017 1251   GLUCOSEU NEGATIVE 03/22/2017 1251   HGBUR NEGATIVE 03/22/2017 1251   [place] 03/22/2017 1251  KETONESUR NEGATIVE 03/22/2017 1251   PROTEINUR NEGATIVE 03/22/2017 1251   NITRITE NEGATIVE 03/22/2017 1251   LEUKOCYTESUR NEGATIVE 03/22/2017 1251   Sepsis Labs: @LABRCNTIP (procalcitonin:4,lacticacidven:4)  )No results found for this or any previous visit (from the past 240 hour(s)).       Radiology Studies: No results found.      Scheduled Meds: . allopurinol  100 mg Oral Daily  . apixaban  10 mg Oral BID   Followed by  . [START ON 04/05/2019] apixaban  5 mg Oral BID  . vitamin C  500 mg Oral Daily  . aspirin EC  81 mg Oral Daily  . cholecalciferol  1,000 Units Oral Daily  . citalopram  20 mg Oral Daily  . dexamethasone (DECADRON) injection  6 mg Intravenous Q24H  . donepezil  5 mg Oral QHS  . famotidine  20 mg Oral Daily  . feeding supplement (ENSURE ENLIVE)  237 mL Oral TID BM  . furosemide  20 mg Oral Daily  . guaiFENesin  600 mg Oral BID  . insulin aspart  0-5 Units Subcutaneous  QHS  . insulin aspart  0-9 Units Subcutaneous TID WC  . levothyroxine  25 mcg Intravenous Daily  . memantine  10 mg Oral BID  . [START ON 03/30/2019] multivitamin with minerals  1 tablet Oral Daily  . pravastatin  40 mg Oral Daily  . zinc sulfate  220 mg Oral Daily   Continuous Infusions: . remdesivir 100 mg in NS 100 mL Stopped (03/29/19 1040)     LOS: 2 days    Time spent: 35 minutes    03/31/19, MD [facility] 03/29/2019, 6:57 PM     Please page though AMION or Epic secure chat:  For password, contact charge nurse

## 2019-03-29 NOTE — Progress Notes (Signed)
ANTICOAGULATION CONSULT NOTE Pharmacy Consult for heparin drip management  Indication: Pulmonary Embolism  Patient Measurements: Height: 5\' 5"  (165.1 cm) Weight: 158 lb 14.4 oz (72.1 kg) IBW/kg (Calculated) : 57 Heparin Dosing Weight: 75.7kg   Vital Signs: Temp: 98.3 F (36.8 C) (01/18 0431) Temp Source: Oral (01/18 0431) BP: 113/62 (01/18 0431) Pulse Rate: 55 (01/18 0431)  Labs: Recent Labs    03/27/19 0121 03/27/19 0121 03/28/19 0018 03/28/19 03/30/19 03/28/19 1306 03/28/19 2310 03/29/19 0653  HGB 13.1   < >  --  11.6*  --   --  11.0*  HCT 41.2  --   --  36.2  --   --  34.1*  PLT 141*  --   --  146*  --   --  144*  APTT 28  --   --   --   --   --   --   HEPARINUNFRC  --   --  1.96*  --  1.02* 0.48  --   CREATININE 1.15*  --   --  1.16*  --   --   --   TROPONINIHS 6  --   --   --   --   --   --    < > = values in this interval not displayed.    Estimated Creatinine Clearance: 33.3 mL/min (A) (by C-G formula based on SCr of 1.16 mg/dL (H)).   Medical History: Past Medical History:  Diagnosis Date  . Asthma   . Dementia (HCC)   . Hyperlipidemia   . Hypertension     Medications:  Scheduled:  . allopurinol  100 mg Oral Daily  . vitamin C  500 mg Oral Daily  . aspirin EC  81 mg Oral Daily  . cholecalciferol  1,000 Units Oral Daily  . citalopram  20 mg Oral Daily  . dexamethasone (DECADRON) injection  6 mg Intravenous Q24H  . donepezil  5 mg Oral QHS  . famotidine  20 mg Oral Daily  . furosemide  20 mg Oral Daily  . guaiFENesin  600 mg Oral BID  . insulin aspart  0-5 Units Subcutaneous QHS  . insulin aspart  0-9 Units Subcutaneous TID WC  . levothyroxine  25 mcg Intravenous Daily  . memantine  10 mg Oral BID  . pravastatin  40 mg Oral Daily  . zinc sulfate  220 mg Oral Daily   Infusions:  . sodium chloride 100 mL/hr at 03/28/19 2323  . heparin 500 Units/hr (03/28/19 1555)  . remdesivir 100 mg in NS 100 mL Stopped (03/28/19 1201)     Assessment: Pharmacy consulted for heparin drip management for 86 yo female admitted with COVID-19. Patient previously received enoxaparin 40mg  SQ, last dose at 0619. Angiography showing subacute or chronic PE. Platelets low at baseline but appears stable, H&H trending down  Heparin Course: 1/16 initiation: 1500 unit bolus, then 1200 units/hr 1/17 0018 HL 1.96  decreased rate to 800 units/hr 1/17 1306 HL 1.02: decreased rate to 500 units/hr 1/17 2310 HL 1.48: no change 1/18 0653 HL 0.29: increased rate to 650 units/hr  Goal of Therapy:  Heparin level 0.3-0.7 units/ml Monitor platelets by anticoagulation protocol: Yes   Plan:   Heparin level subtherapeutic: increase rate to 650 units/hr  recheck heparin level 8 hours after rate change  CBC in am  2/17, [facility] Clinical Pharmacist 03/29/2019,7:29 AM

## 2019-03-30 NOTE — TOC Progression Note (Signed)
Transition of Care [facility]) - Progression Note    Patient Details  Name: Stephanie Duarte MRN: [id number] Date of Birth: 07-18-1928  Transition of [facility] CM/SW Contact  Chapman Fitch, RN Phone Number: 03/30/2019, 5:09 PM  Clinical Narrative:    Patient from [facility] Patient to transition to hospice services.   RNCM confirmed with Airline pilot at [facility] that patient can return with hospice services.   RNCM discussed disposition with daughter and son In law Their preference is Civil engineer, contracting.  Referral made to [facility]  Patient will need hospital bed at discharge per MD.  Clydie Braun notified         Expected Discharge Plan and Services                                                 Social Determinants of Health (SDOH) Interventions    Readmission Risk Interventions No flowsheet data found.

## 2019-03-30 NOTE — Progress Notes (Signed)
PROGRESS NOTE    Stephanie Duarte  EPP:[id number] DOB: 10/08/1928 DOA: 03/27/2019 PCP: Marisue Ivan, MD      Brief Narrative:  Stephanie Duarte is a 86 y.o. F with dementia lives in [facility] memory care, asthma, and HTN who presented with hypoxia and respiratory distress for 1 day.    Diagnosed with COVID-19 few days PTA.  On day of admission, SpO2 86% and had increased work of breathing.    In the ER, SpO2 87% and RR 23.  COVID+.  CXR clear.  CTA showed nonocclusive right LL pulmonary artery.  Given Solu-medrol and bronchodilators for wheezing.  Started on IV remdesivir.          Assessment & Plan:  Covid pneumonitis with acute hypoxic respiratory failure Patient presented with hypoxia, COVID+ and respiratory distress in the setting of hte ongoing 2020-2021 COVID-19 pandemic.  Started on remdesivir and steroids at admission.  Nutrition were consulted as well as PT.  However the patient remained severely delirious and encephalopathic.  She refuses food, and is unable to keep up with her fluid and nutritional needs.  In my medical experience remdesivir and steroids have no observed benefit in treating Covid encephalopathy, and there is no report of section literature.  Further, the patient's hypoxia and Covid pneumonitis has resolved already.  -Discontinue remdesivir and steroids  Given her advanced age, advanced dementia, limited functional status at baseline, her prognosis has gotten progressively worse.  Given that she is unable to obtain any nutrition fluid intake by mouth, and has demonstrated for several days, worsening mentation, I suspect that this is an irreversible condition.  Family and I discussed goals of care, and a feeding tube and prolonging her life by artificial means was explicitly against the patient's wishes.  In light of this, I recommended, and family feel in agreement, that hospice is warranted, to allow the patient a natural death, with comfort and dignity.    It is my opinion that if the patient continues in her current course, and does not eat or drink, she will likely survive only a few days to a week. It is my medical opinion that even if her oral intake improves marginally, her severe encephalopathy from Covid superimposed on her baseline dementia mean that in a matter of weeks without IV fluids or PEG tube (which would constitute artificial means of prolonging life that patietn would not have wanted), she will likely develop renal failure and severe hypernatremia/dehydration and expire.     -Comfort measures -Morphine for pain, Ativan for anxiety   Delirium, inability to swallow  Pulmonary embolism -Discontinue anticoagulant -Comfort measures  Hypothyroidism -Discontinue levothyroxine  Diabetes -Discontinue Accu-Cheks  Dementia with acute metabolic encephalopathy Per daughter by phone, at baseline patient is wheelchair bound, nonambulatory.  Has had decline in cognition in last year due to social isolation, now sometimes does not recognize her.  Asthma without exacerbation -Continue albuterol PRN  Hypertension History TIA, CV disease secondary prevention  Gout  GERD   CKD IIIb           Disposition: Patient was admitted with coronavirus, and new PE.    Her cognitive status has steadily declined, she has consistently refused food and drink, and based on her expressed wishes prior to onset of dementia, the patient would not have wanted heroic measures nor her life extended by artificial means.    We will discontinue the above treatments, and pursue comfort measures.    Family are actively working with case management  to arrange transfer back to her assisted living facility with hospice following, versus exploring the option of inpatient hospice.  This determination is pending        DVT prophylaxis: Comfort measures Code Status: DNR Family Communication:   MDM:  The below labs and imaging reports reviewed  and summarized above.  Medication management as above.      SUbjective: The patient looks at me briefly, takes off her oxygen, refuses food or drink, remains incoherent. No fever, no apparent pain.       Objective: Vitals:   03/29/19 0431 03/29/19 1249 03/29/19 2015 03/30/19 1153  BP: 113/62 115/66 (!) 115/58 (!) 132/93  Pulse: (!) 55 (!) 52 63 61  Resp: 20 18 (!) 24 18  Temp: 98.3 F (36.8 C) 98.2 F (36.8 C) 97.8 F (36.6 C) 98.4 F (36.9 C)  TempSrc: Oral Axillary Oral Oral  SpO2: 97% 98% 98% 97%  Weight:      Height:        Intake/Output Summary (Last 24 hours) at 03/30/2019 1750 Last data filed at 03/30/2019 1500 Gross per 24 hour  Intake 407.17 ml  Output 500 ml  Net -92.83 ml   Filed Weights   03/27/19 0052 03/27/19 2305  Weight: 86.2 kg 72.1 kg    Examination: General appearance: Elderly adult female, awake, but inattentive, sometimes makes eye contact, overall seems agitated, and does not follow commands HEENT: Anicteric, conjunctiva pink, lids and lashes normal. No nasal deformity, discharge, epistaxis.  Lips dry, oropharynx dry, no oral lesions, hearing seems normal.  Skin: Dry and tented Cardiac: Tachycardic, no murmurs, no lower extremity edema Respiratory: Seems tachypneic, due to agitation not respiratory distress.  No accessory muscle use, lung sounds diminished, no rales or wheezes. Abdomen: Abdomen soft, no tenderness palpation, no ascites or distention MSK: No deformities or effusions of the large joints of the upper or lower extremities bilaterally. Neuro: Awake but does not follow commands, speaks incoherently.  Does not make eye contact consistently. Psych: Unable to assess     Data Reviewed: I have personally reviewed following labs and imaging studies:  CBC: Recent Labs  Lab 03/27/19 0121 03/28/19 0632 03/29/19 0653  WBC 4.9 7.3 6.7  NEUTROABS 1.9 4.8 4.7  HGB 13.1 11.6* 11.0*  HCT 41.2 36.2 34.1*  MCV 96.5 95.8 95.8  PLT 141*  146* 932*   Basic Metabolic Panel: Recent Labs  Lab 03/27/19 0121 03/28/19 0632 03/29/19 0653  NA 138 139 141  K 4.6 4.1 3.9  CL 98 102 107  CO2 30 28 26   GLUCOSE 124* 108* 98  BUN 22 24* 24*  CREATININE 1.15* 1.16* 0.98  CALCIUM 9.0 8.6* 8.2*   GFR: Estimated Creatinine Clearance: 39.5 mL/min (by C-G formula based on SCr of 0.98 mg/dL). Liver Function Tests: Recent Labs  Lab 03/27/19 0121 03/28/19 0632 03/29/19 0653  AST 36 27 43*  ALT 41 28 32  ALKPHOS 78 62 57  BILITOT 0.7 0.7 0.7  PROT 6.6 5.9* 5.6*  ALBUMIN 3.7 3.1* 3.0*   No results for input(s): LIPASE, AMYLASE in the last 168 hours. No results for input(s): AMMONIA in the last 168 hours. Coagulation Profile: No results for input(s): INR, PROTIME in the last 168 hours. Cardiac Enzymes: No results for input(s): CKTOTAL, CKMB, CKMBINDEX, TROPONINI in the last 168 hours. BNP (last 3 results) No results for input(s): PROBNP in the last 8760 hours. HbA1C: Recent Labs    03/28/19 0632  HGBA1C 6.9*   CBG: Recent  Labs  Lab 03/28/19 1636 03/28/19 2153 03/29/19 0917 03/29/19 1205 03/29/19 1716  GLUCAP 134* 145* 74 92 124*   Lipid Profile: No results for input(s): CHOL, HDL, LDLCALC, TRIG, CHOLHDL, LDLDIRECT in the last 72 hours. Thyroid Function Tests: No results for input(s): TSH, T4TOTAL, FREET4, T3FREE, THYROIDAB in the last 72 hours. Anemia Panel: Recent Labs    03/28/19 0632 03/29/19 0653  FERRITIN 224 203   Urine analysis:    Component Value Date/Time   COLORURINE STRAW (A) 03/22/2017 1251   APPEARANCEUR CLEAR (A) 03/22/2017 1251   LABSPEC 1.010 03/22/2017 1251   PHURINE 5.0 03/22/2017 1251   GLUCOSEU NEGATIVE 03/22/2017 1251   HGBUR NEGATIVE 03/22/2017 1251   BILIRUBINUR NEGATIVE 03/22/2017 1251   KETONESUR NEGATIVE 03/22/2017 1251   PROTEINUR NEGATIVE 03/22/2017 1251   NITRITE NEGATIVE 03/22/2017 1251   LEUKOCYTESUR NEGATIVE 03/22/2017 1251   Sepsis Labs:  @LABRCNTIP (procalcitonin:4,lacticacidven:4)  )No results found for this or any previous visit (from the past 240 hour(s)).       Radiology Studies: No results found.      Scheduled Meds: . feeding supplement (ENSURE ENLIVE)  237 mL Oral TID BM   Continuous Infusions:    LOS: 3 days    Time spent: 25 minutes    , MD [facility] 03/30/2019, 5:50 PM     Please page though AMION or Epic secure chat:  For password, contact charge nurse

## 2019-03-30 NOTE — Plan of Care (Signed)
Patient on comfort care with expected discharge to hospice tomorrow. Patient given emotional support due to anxiety and hallucinations. Confused at baseline. Repositioned q2-3 hours as needed. Patient refused PO intake except bites and sips. Will continue to monitor.

## 2019-03-30 NOTE — Progress Notes (Signed)
New referral for [employer] [facility] hospice services at [facility] received from [facility]. DME needs discussed. Plan is for discharge tomorrow after DME is delivered. Patient information given to referral. Will continue to follow through discharge. Thank you for this referral. Dayna Barker BSN, RN, [facility] [employer] [phone]

## 2019-03-30 NOTE — Care Management Important Message (Signed)
Important Message  Patient Details  Name: Stephanie Duarte MRN: [id number] Date of Birth: Aug 17, 1928   Medicare Important Message Given:  Yes  Reviewed verbally with daughter, Lawerance Cruel, at [phone].  Requested copy of Medicare IM be emailed to her at [email or web address].  Verified address and securely emailed.     Johnell Comings 03/30/2019, 11:30 AM

## 2019-03-31 DIAGNOSIS — J9601 Acute respiratory failure with hypoxia: Secondary | ICD-10-CM

## 2019-03-31 DIAGNOSIS — Z66 Do not resuscitate: Secondary | ICD-10-CM

## 2019-03-31 MED ORDER — LORAZEPAM 0.5 MG PO TABS: 0.5000 mg | ORAL_TABLET | ORAL | 0 refills | Status: AC | PRN

## 2019-03-31 MED ORDER — MORPHINE SULFATE (CONCENTRATE) 20 MG/ML PO SOLN: 5.0000 mg | ORAL | 0 refills | Status: AC | PRN

## 2019-03-31 NOTE — Discharge Instructions (Signed)
Hospice Hospice is a service that is designed to provide people who are terminally ill and their families with medical, spiritual, and psychological support. Its aim is to improve your quality of life by keeping you as comfortable as possible in the final stages of life. Who will be my providers when I begin hospice care? Hospice teams often include:  A nurse.  A doctor. The hospice doctor will be available for your care, but you can include your regular doctor or nurse practitioner.  A social worker.  A counselor.  A religious leader (such as a chaplain).  A dietitian.  Therapists.  Trained volunteers who can help with care. What services does hospice provide? Hospice services can vary depending on the center or organization. Generally, they include:  Ways to keep you comfortable, such as: ? Providing care in your home or in a home-like setting. ? Working with your family and friends to help meet your needs. ? Allowing you to enjoy the support of loved ones by receiving much of your basic care from family and friends.  Pain relief and symptom management. The staff will supply all necessary medicines and equipment so that you can stay comfortable and alert enough to enjoy the company of your friends and family.  Visits or care from a nurse and doctor. This may include 24-hour on-call services.  Companionship when you are alone.  Allowing you and your family to rest. Hospice staff may do light housekeeping, prepare meals, and run errands.  Counseling. They will make sure your emotional, spiritual, and social needs are being met, as well as those needs of your family members.  Spiritual care. This will be individualized to meet your needs and your family's needs. It may involve: ? Helping you and your family understand the dying process. ? Helping you say goodbye to your family and friends. ? Performing a specific religious ceremony or ritual.  Massage.  Nutrition  therapy.  Physical and occupational therapy.  Short-term inpatient care, if something cannot be managed in the home.  Art or music therapy.  Bereavement support for grieving family members. When should hospice care begin? Most people who use hospice are believed to have less than 6 months to live.  Your family and health care providers can help you decide when hospice services should begin.  If you live longer than 6 months but your condition does not improve, your doctor may be able to approve you for continued hospice care.  If your condition improves, you may discontinue the program. What should I consider before selecting a program? Most hospice programs are run by nonprofit, independent organizations. Some are affiliated with hospitals, nursing homes, or home health care agencies. Hospice programs can take place in your home or at a hospice center, hospital, or skilled nursing facility. When choosing a hospice program, ask the following questions:  What services are available to me?  What services will be offered to my loved ones?  How involved will my loved ones be?  How involved will my health care provider be?  Who makes up the hospice care team? How are they trained or screened?  How will my pain and symptoms be managed?  If my circumstances change, can the services be provided in a different setting, such as my home or in the hospital?  Is the program reviewed and licensed by the state or certified in some other way?  What does it cost? Is it covered by insurance?  If I choose a hospice   center or nursing home, where is the hospice center located? Is it convenient for family and friends?  If I choose a hospice center or nursing home, can my family and friends visit any time?  Will you provide emotional and spiritual support?  Who can my family call with questions? Where can I learn more about hospice? You can learn about existing hospice programs in your area  from your health care providers. You can also read more about hospice online. The websites of the following organizations have helpful information:  [facility] and Palliative Care Organization [facility]): http://www.brown-buchanan.com/  National Association for [facility]): http://massey-hart.com/  Hospice Foundation of [place] ([place]): www.hospicefoundation.org  American Cancer Society (ACS): www.cancer.org  Hospice Net: www.hospicenet.org  Visiting Nurse Associations of [place] (VNAA): www.vnaa.org You may also find more information by contacting the following agencies:  A local agency on aging.  Your local [employer] chapter.  Your state's department of health or social services. Summary  Hospice is a service that is designed to provide people who are terminally ill and their families with medical, spiritual, and psychological support.  Hospice aims to improve your quality of life by keeping you as comfortable as possible in the final stages of life.  Hospice teams often include a doctor, nurse, social worker, counselor, religious leader,dietitian, therapists, and volunteers.  Hospice care generally includes medicine for symptom management, visits from doctors and nurses, physical and occupational therapy, nutrition counseling, spiritual and emotional counseling, caregiver support, and bereavement support for grieving family members.  Hospice programs can take place in your home or at a hospice center, hospital, or skilled nursing facility. This information is not intended to replace advice given to you by your health care provider. Make sure you discuss any questions you have with your health care provider. Document Revised: 11/18/2018 Document Reviewed: 03/19/2016 Elsevier Patient Education  2020 [place]: How to Protect Yourself and Others Know how it spreads  There is currently no vaccine to prevent coronavirus disease 2019 (COVID-19).  The best way to prevent  illness is to avoid being exposed to this virus.  The virus is thought to spread mainly from person-to-person. ? Between people who are in close contact with one another (within about 6 feet). ? Through respiratory droplets produced when an infected person coughs, sneezes or talks. ? These droplets can land in the mouths or noses of people who are nearby or possibly be inhaled into the lungs. ? COVID-19 may be spread by people who are not showing symptoms. Everyone should Clean your hands often  Wash your hands often with soap and water for at least 20 seconds especially after you have been in a public place, or after blowing your nose, coughing, or sneezing.  If soap and water are not readily available, use a hand sanitizer that contains at least 60% alcohol. Cover all surfaces of your hands and rub them together until they feel dry.  Avoid touching your eyes, nose, and mouth with unwashed hands. Avoid close contact  Limit contact with others as much as possible.  Avoid close contact with people who are sick.  Put distance between yourself and other people. ? Remember that some people without symptoms may be able to spread virus. ? This is especially important for people who are at higher risk of getting very GainPain.com.cy Cover your mouth and nose with a mask when around others  You could spread COVID-19 to others even if you do not feel  sick.  Everyone should wear a mask in public settings and when around people not living in their household, especially when social distancing is difficult to maintain. ? Masks should not be placed on young children under age 44, anyone who has trouble breathing, or is unconscious, incapacitated or otherwise unable to remove the mask without assistance.  The mask is meant to protect other people in case you are infected.  Do NOT use a facemask meant for a Psychologist, forensic.  Continue to keep about 6 feet between yourself and others. The mask is not a substitute for social distancing. Cover coughs and sneezes  Always cover your mouth and nose with a tissue when you cough or sneeze or use the inside of your elbow.  Throw used tissues in the trash.  Immediately wash your hands with soap and water for at least 20 seconds. If soap and water are not readily available, clean your hands with a hand sanitizer that contains at least 60% alcohol. Clean and disinfect  Clean AND disinfect frequently touched surfaces daily. This includes tables, doorknobs, light switches, countertops, handles, desks, phones, keyboards, toilets, faucets, and sinks. RackRewards.fr  If surfaces are dirty, clean them: Use detergent or soap and water prior to disinfection.  Then, use a household disinfectant. You can see a list of EPA-registered household disinfectants here. michellinders.com 11/11/2018 This information is not intended to replace advice given to you by your health care provider. Make sure you discuss any questions you have with your health care provider. Document Revised: 11/19/2018 Document Reviewed: 09/17/2018 Elsevier Patient Education  [place].

## 2019-03-31 NOTE — Progress Notes (Signed)
Patient IV's was removed. Catheter intact. Patient alert to self only.  Patient  Appears stable. Discharge summery  given to EMS.  EMS transported patient  back to [facility] facility.

## 2019-03-31 NOTE — Discharge Summary (Signed)
5        [facility]   PATIENT NAME: Stephanie Duarte    MR#:  [id number]  DATE OF BIRTH:  February 23, 1929  DATE OF ADMISSION:  03/27/2019   ADMITTING PHYSICIAN: Hannah Beat, MD  DATE OF DISCHARGE: 03/31/2019  PRIMARY CARE PHYSICIAN: Marisue Ivan, MD   ADMISSION DIAGNOSIS:  Acute respiratory failure with hypoxia (HCC) [J96.01] COVID-19 [U07.1] DISCHARGE DIAGNOSIS:  Active Problems:   COVID-19   DNR (do not resuscitate)   Acute respiratory failure with hypoxia (HCC)  SECONDARY DIAGNOSIS:   Past Medical History:  Diagnosis Date  . Asthma   . Dementia (HCC)   . Hyperlipidemia   . Hypertension    HOSPITAL COURSE:  Stephanie Duarte is a 86 y.o. F with dementia lives in [facility] memory care, asthma, and HTN admitted for hypoxia and respiratory distress. Diagnosed with COVID-19 few days PTA. On day of admission, SpO2 86% and had increased work of breathing.    In the ER, SpO2 87% and RR 23.  COVID+.  CXR clear.  CTA showed nonocclusive right LL pulmonary artery.  Given Solu-medrol and bronchodilators for wheezing.  Started on IV remdesivir.    Covid pneumonitis with acute hypoxic respiratory failure Given her advanced age, advanced dementia, limited functional status at baseline, her prognosis has gotten progressively worse.  Given that she is unable to obtain any nutrition fluid intake by mouth, and has demonstrated for several days, worsening mentation, we suspect that this is an irreversible condition.  Family discussion held for goals of care and decision for hospice is confirmed, to allow the patient a natural death, with comfort and dignity.  - Comfort measures - Morphine for pain, Ativan for anxiety  Delirium, inability to swallow  Pulmonary embolism  Hypothyroidism  Diabetes  Dementia with acute metabolic encephalopathy Per daughter by phone, at baseline patient is wheelchair bound, nonambulatory.   Asthma without  exacerbation  Hypertension History TIA, CV disease secondary prevention  Gout  GERD   CKD IIIb DISCHARGE CONDITIONS:  Fair CONSULTS OBTAINED:   DRUG ALLERGIES:   Allergies  Allergen Reactions  . Meloxicam Swelling   DISCHARGE MEDICATIONS:   Allergies as of 03/31/2019      Reactions   Meloxicam Swelling      Medication List    STOP taking these medications   acetaminophen 325 MG tablet Commonly known as: TYLENOL   albuterol (2.5 MG/3ML) 0.083% nebulizer solution Commonly known as: PROVENTIL   allopurinol 100 MG tablet Commonly known as: ZYLOPRIM   aspirin 81 MG tablet   budesonide 0.5 MG/2ML nebulizer solution Commonly known as: PULMICORT   cholecalciferol 1000 units tablet Commonly known as: VITAMIN D   citalopram 20 MG tablet Commonly known as: CELEXA   docusate sodium 100 MG capsule Commonly known as: COLACE   donepezil 5 MG tablet Commonly known as: ARICEPT   doxycycline 50 MG/5ML Syrp Commonly known as: VIBRAMYCIN   eucerin cream   EUCERIN ECZEMA RELIEF EX   furosemide 20 MG tablet Commonly known as: LASIX   guaiFENesin-dextromethorphan 100-10 MG/5ML syrup Commonly known as: ROBITUSSIN DM   levothyroxine 50 MCG tablet Commonly known as: SYNTHROID   memantine 10 MG tablet Commonly known as: NAMENDA   metFORMIN 500 MG tablet Commonly known as: GLUCOPHAGE   polyethylene glycol 17 g packet Commonly known as: MIRALAX / GLYCOLAX   potassium chloride 10 MEQ tablet Commonly known as: KLOR-CON   pravastatin 40 MG tablet Commonly known as: PRAVACHOL  predniSONE 50 MG tablet Commonly known as: DELTASONE     TAKE these medications   LORazepam 0.5 MG tablet Commonly known as: ATIVAN Take 1 tablet (0.5 mg total) by mouth every 4 (four) hours as needed for anxiety.   morphine 20 MG/ML concentrated solution Commonly known as: ROXANOL Take 0.25 mLs (5 mg total) by mouth every 2 (two) hours as needed for severe pain.       DISCHARGE INSTRUCTIONS:   DIET:  Regular diet DISCHARGE CONDITION:  Fair ACTIVITY:  Activity as tolerated OXYGEN:  Home Oxygen: No.  Oxygen Delivery: room air DISCHARGE LOCATION:  nursing home with Hospice  If you experience worsening of your admission symptoms, develop shortness of breath, life threatening emergency, suicidal or homicidal thoughts you must seek medical attention immediately by calling 911 or calling your MD immediately  if symptoms less severe.  You Must read complete instructions/literature along with all the possible adverse reactions/side effects for all the Medicines you take and that have been prescribed to you. Take any new Medicines after you have completely understood and accpet all the possible adverse reactions/side effects.   Please note  You were cared for by a hospitalist during your hospital stay. If you have any questions about your discharge medications or the care you received while you were in the hospital after you are discharged, you can call the unit and asked to speak with the hospitalist on call if the hospitalist that took care of you is not available. Once you are discharged, your primary care physician will handle any further medical issues. Please note that NO REFILLS for any discharge medications will be authorized once you are discharged, as it is imperative that you return to your primary care physician (or establish a relationship with a primary care physician if you do not have one) for your aftercare needs so that they can reassess your need for medications and monitor your lab values.    On the day of Discharge:  VITAL SIGNS:  Blood pressure (!) 116/36, pulse 70, temperature 98.7 F (37.1 C), temperature source Oral, resp. rate 18, height 5\' 5"  (1.651 m), weight 72.1 kg, SpO2 95 %. PHYSICAL EXAMINATION:  General appearance: Elderly adult female, awake, but inattentive, sometimes makes eye contact, overall seems agitated, and does  not follow commands HEENT: Anicteric, conjunctiva pink, lids and lashes normal. No nasal deformity, discharge, epistaxis.  Lips dry, oropharynx dry, no oral lesions, hearing seems normal.  Skin: Dry and tented Cardiac: Tachycardic, no murmurs, no lower extremity edema Respiratory: Seems tachypneic, due to agitation not respiratory distress.  No accessory muscle use, lung sounds diminished, no rales or wheezes. Abdomen: Abdomen soft, no tenderness palpation, no ascites or distention MSK: No deformities or effusions of the large joints of the upper or lower extremities bilaterally. Neuro: Awake but does not follow commands, speaks incoherently.  Does not make eye contact consistently. Psych: Unable to assess DATA REVIEW:   CBC Recent Labs  Lab 03/29/19 0653  WBC 6.7  HGB 11.0*  HCT 34.1*  PLT 144*    Chemistries  Recent Labs  Lab 03/29/19 0653  NA 141  K 3.9  CL 107  CO2 26  GLUCOSE 98  BUN 24*  CREATININE 0.98  CALCIUM 8.2*  AST 43*  ALT 32  ALKPHOS 57  BILITOT 0.7     Follow-up Information    Dion Body, MD. Go on 04/09/2019.   Specialty: Family Medicine Why: 8:30am appointment Contact information: [facility]  [facility] [place] [address] [phone]           Management plans discussed with the patient, family and they are in agreement.  CODE STATUS: DNR   TOTAL TIME TAKING CARE OF THIS PATIENT: 45 minutes.    Delfino Lovett M.D on 03/31/2019 at 1:17 PM  [facility]   CC: Primary care physician; Marisue Ivan, MD   Note: This dictation was prepared with Dragon dictation along with smaller phrase technology. Any transcriptional errors that result from this process are unintentional.

## 2019-03-31 NOTE — TOC Transition Note (Signed)
Transition of Care [facility]) - CM/SW Discharge Note   Patient Details  Name: Stephanie Duarte MRN: [id number] Date of Birth: February 01, 1929  [facility]) CM/SW Contact:  Chapman Fitch, RN Phone Number: 03/31/2019, 4:55 PM   Clinical Narrative:    Patient to return to [facility] with hospice today Clydie Braun with [employer] has order PRN O2, and hospital bed  [facility] confirmed with Lynden Ang at [facility] that equipment has been delivered  EMS packet and signed DNR on chart.  Bedside RN notified Updated Fl2 and signed prescriptions in discharge packet   Daughter notified of discharge   Final next level of care: Assisted Living(with hospice) Barriers to Discharge: No Barriers Identified   Patient Goals and CMS Choice        Discharge Placement                Patient to be transferred to facility by: EMS Name of family member notified: daughter Patient and family notified of of transfer: 03/31/19  Discharge Plan and Services                                     Social Determinants of Health (SDOH) Interventions     Readmission Risk Interventions No flowsheet data found.

## 2019-03-31 NOTE — Progress Notes (Signed)
Writer spoke with patent's daughter Mitzi via telephone to initiate education regarding hospice services, philosophy and team approach to care with understanding voiced. Plan is for discharge back to [facility] care today via EMS when DME is in place. Signed out of facility DNR and comfort medication prescriptions to accompany patient at discharge. Dr. Sherryll Burger and [facility] Ngo aware. Mitzi has requested face time visit. Staff RN Terri notified of request. Felipe Drone you for the opportunity to be involved in the care of this patient and her family. Dayna Barker BSN, RN, [facility] Liaison [facility] [phone]

## 2019-03-31 NOTE — NC FL2 (Signed)
[place] MEDICAID FL2 LEVEL OF CARE SCREENING TOOL     IDENTIFICATION  Patient Name: Stephanie Duarte Birthdate: Feb 27, 1929 Sex: female Admission Date (Current Location): 03/27/2019  [facility] and [place] Number:  Chiropodist and Address:         Provider Number: [id number]  Attending Physician Name and Address:  Delfino Lovett, MD  Relative Name and Phone Number:       Current Level of Care: Hospital Recommended Level of Care: Memory Care(Hospice) Prior Approval Number:    Date Approved/Denied:   PASRR Number:    Discharge Plan: Other (Comment)(Memory care with hospice)    Current Diagnoses: Patient Active Problem List   Diagnosis Date Noted  . Acute respiratory failure with hypoxia (HCC)   . COVID-19 03/27/2019  . DNR (do not resuscitate) 03/27/2019  . Asthma exacerbation 04/11/2018  . Pneumonia 03/12/2018  . Fever 07/04/2015    Orientation RESPIRATION BLADDER Height & Weight        (however hospice has ordered O2 prn) Incontinent Weight: 72.1 kg Height:  5\' 5"  (165.1 cm)  BEHAVIORAL SYMPTOMS/MOOD NEUROLOGICAL BOWEL NUTRITION STATUS      Incontinent Diet(Dys 2 thin liquid)  AMBULATORY STATUS COMMUNICATION OF NEEDS Skin   Total Care(Goal to keep comfort with hospice) Verbally Skin abrasions                       Personal Care Assistance Level of Assistance              Functional Limitations Info             SPECIAL CARE FACTORS FREQUENCY                       Contractures Contractures Info: Not present    Additional Factors Info  Isolation Precautions Code Status Info: SNR Allergies Info: Meloxicam           STOP taking these medications   acetaminophen 325 MG tablet Commonly known as: TYLENOL   albuterol (2.5 MG/3ML) 0.083% nebulizer solution Commonly known as: PROVENTIL   allopurinol 100 MG tablet Commonly known as: ZYLOPRIM   aspirin 81 MG tablet   budesonide 0.5 MG/2ML nebulizer solution Commonly  known as: PULMICORT   cholecalciferol 1000 units tablet Commonly known as: VITAMIN D   citalopram 20 MG tablet Commonly known as: CELEXA   docusate sodium 100 MG capsule Commonly known as: COLACE   donepezil 5 MG tablet Commonly known as: ARICEPT   doxycycline 50 MG/5ML Syrp Commonly known as: VIBRAMYCIN   eucerin cream   EUCERIN ECZEMA RELIEF EX   furosemide 20 MG tablet Commonly known as: LASIX   guaiFENesin-dextromethorphan 100-10 MG/5ML syrup Commonly known as: ROBITUSSIN DM   levothyroxine 50 MCG tablet Commonly known as: SYNTHROID   memantine 10 MG tablet Commonly known as: NAMENDA   metFORMIN 500 MG tablet Commonly known as: GLUCOPHAGE   polyethylene glycol 17 g packet Commonly known as: MIRALAX / GLYCOLAX   potassium chloride 10 MEQ tablet Commonly known as: KLOR-CON   pravastatin 40 MG tablet Commonly known as: PRAVACHOL   predniSONE 50 MG tablet Commonly known as: DELTASONE     TAKE these medications   LORazepam 0.5 MG tablet Commonly known as: ATIVAN Take 1 tablet (0.5 mg total) by mouth every 4 (four) hours as needed for anxiety.   morphine 20 MG/ML concentrated solution Commonly known as: ROXANOL Take 0.25 mLs (5 mg total) by mouth every 2 (  two) hours as needed for severe pain.     Relevant Imaging Results:  Relevant Lab Results:   Additional Information [phone]  Beverly Sessions, RN

## 2019-04-12 ENCOUNTER — Other Ambulatory Visit: Payer: Self-pay | Admitting: Internal Medicine

## 2019-04-12 DIAGNOSIS — U071 COVID-19: Secondary | ICD-10-CM

## 2019-04-26 ENCOUNTER — Other Ambulatory Visit: Payer: Self-pay | Admitting: Internal Medicine

## 2020-03-09 ENCOUNTER — Emergency Department

## 2020-03-09 ENCOUNTER — Other Ambulatory Visit: Payer: Self-pay

## 2020-03-09 ENCOUNTER — Emergency Department
Admission: EM | Admit: 2020-03-09 | Discharge: 2020-03-09 | Disposition: A | Discharge status: Home / Self Care | Attending: Emergency Medicine | Admitting: Emergency Medicine

## 2020-03-09 DIAGNOSIS — N390 Urinary tract infection, site not specified: Secondary | ICD-10-CM | POA: Diagnosis not present

## 2020-03-09 DIAGNOSIS — J45901 Unspecified asthma with (acute) exacerbation: Secondary | ICD-10-CM | POA: Diagnosis not present

## 2020-03-09 DIAGNOSIS — Z8616 Personal history of COVID-19: Secondary | ICD-10-CM | POA: Insufficient documentation

## 2020-03-09 DIAGNOSIS — F0390 Unspecified dementia without behavioral disturbance: Secondary | ICD-10-CM | POA: Insufficient documentation

## 2020-03-09 DIAGNOSIS — I10 Essential (primary) hypertension: Secondary | ICD-10-CM | POA: Insufficient documentation

## 2020-03-09 DIAGNOSIS — Z20822 Contact with and (suspected) exposure to covid-19: Secondary | ICD-10-CM | POA: Diagnosis not present

## 2020-03-09 DIAGNOSIS — R296 Repeated falls: Secondary | ICD-10-CM | POA: Diagnosis not present

## 2020-03-09 LAB — CBC WITH DIFFERENTIAL/PLATELET
Abs Immature Granulocytes: 0.06 10*3/uL (ref 0.00–0.07)
Basophils Absolute: 0.1 10*3/uL (ref 0.0–0.1)
Basophils Relative: 1 %
Eosinophils Absolute: 0.2 10*3/uL (ref 0.0–0.5)
Eosinophils Relative: 2 %
HCT: 40.9 % (ref 36.0–46.0)
Hemoglobin: 12.6 g/dL (ref 12.0–15.0)
Immature Granulocytes: 1 %
Lymphocytes Relative: 18 %
Lymphs Abs: 1.8 10*3/uL (ref 0.7–4.0)
MCH: 30.4 pg (ref 26.0–34.0)
MCHC: 30.8 g/dL (ref 30.0–36.0)
MCV: 98.6 fL (ref 80.0–100.0)
Monocytes Absolute: 0.7 10*3/uL (ref 0.1–1.0)
Monocytes Relative: 7 %
Neutro Abs: 6.9 10*3/uL (ref 1.7–7.7)
Neutrophils Relative %: 71 %
Platelets: 201 10*3/uL (ref 150–400)
RBC: 4.15 MIL/uL (ref 3.87–5.11)
RDW: 14.1 % (ref 11.5–15.5)
WBC: 9.7 10*3/uL (ref 4.0–10.5)
nRBC: 0 % (ref 0.0–0.2)

## 2020-03-09 LAB — URINALYSIS, COMPLETE (UACMP) WITH MICROSCOPIC
Bilirubin Urine: NEGATIVE
Glucose, UA: NEGATIVE mg/dL
Hgb urine dipstick: NEGATIVE
Ketones, ur: NEGATIVE mg/dL
Nitrite: POSITIVE — AB
Protein, ur: 100 mg/dL — AB
Specific Gravity, Urine: 1.017 (ref 1.005–1.030)
WBC, UA: >50 WBC/hpf — ABNORMAL HIGH (ref 0–5)
pH: 6 (ref 5.0–8.0)

## 2020-03-09 LAB — COMPREHENSIVE METABOLIC PANEL
ALT: 58 U/L — ABNORMAL HIGH (ref 0–44)
AST: 68 U/L — ABNORMAL HIGH (ref 15–41)
Albumin: 3.4 g/dL — ABNORMAL LOW (ref 3.5–5.0)
Alkaline Phosphatase: 61 U/L (ref 38–126)
Anion gap: 12 (ref 5–15)
BUN: 25 mg/dL — ABNORMAL HIGH (ref 8–23)
CO2: 23 mmol/L (ref 22–32)
Calcium: 8.3 mg/dL — ABNORMAL LOW (ref 8.9–10.3)
Chloride: 107 mmol/L (ref 98–111)
Creatinine, Ser: 1.23 mg/dL — ABNORMAL HIGH (ref 0.44–1.00)
GFR, Estimated: 42 mL/min — ABNORMAL LOW (ref >60)
Glucose, Bld: 110 mg/dL — ABNORMAL HIGH (ref 70–99)
Potassium: 4.3 mmol/L (ref 3.5–5.1)
Sodium: 142 mmol/L (ref 135–145)
Total Bilirubin: 1.2 mg/dL (ref 0.3–1.2)
Total Protein: 6.4 g/dL — ABNORMAL LOW (ref 6.5–8.1)

## 2020-03-09 LAB — POC SARS CORONAVIRUS 2 AG -  ED: SARS Coronavirus 2 Ag: NEGATIVE

## 2020-03-09 LAB — RESP PANEL BY RT-PCR (FLU A&B, COVID) ARPGX2
Influenza A by PCR: NEGATIVE
Influenza B by PCR: NEGATIVE
SARS Coronavirus 2 by RT PCR: NEGATIVE

## 2020-03-09 LAB — TYPE AND SCREEN
ABO/RH(D): O POS
Antibody Screen: NEGATIVE

## 2020-03-09 MED ORDER — SODIUM CHLORIDE 0.9 % IV SOLN
1.0000 g | Freq: Once | INTRAVENOUS | Status: AC
  Administered 2020-03-09: 23:00:00 1 g via INTRAVENOUS
  Filled 2020-03-09: qty 10

## 2020-03-09 MED ORDER — SODIUM CHLORIDE 0.9 % IV BOLUS
500.0000 mL | Freq: Once | INTRAVENOUS | Status: AC
  Administered 2020-03-09: 23:00:00 500 mL via INTRAVENOUS

## 2020-03-09 MED ORDER — CEPHALEXIN 500 MG PO CAPS: 500.0000 mg | ORAL_CAPSULE | Freq: Four times a day (QID) | ORAL | 0 refills | Status: AC

## 2020-03-09 NOTE — ED Notes (Signed)
Lab called to redraw type and screen due to it being hemolyzed. Lab said they will be here to draw shortly.

## 2020-03-09 NOTE — ED Notes (Signed)
From memory care unable to sign dc paperwork , ed rn called memory care fACILITY AND REPORTED OFF TO NURSE

## 2020-03-09 NOTE — ED Triage Notes (Signed)
Pt arrived to ED via [facility] from [facility] with c/o mechanical fall at around 1225 today. Per EMS, fall was witnessed by roommate and roommate states pt fell face first out of rocking chair.   Pt visualized with large contusion of L eye, blood in R nare, skin tear of L knee, and outward rotation of L leg.   Per EMS, pt had pain/tenderness in pelvis. Per EMS, is not on blood thinners.

## 2020-03-09 NOTE — ED Notes (Signed)
Report given to [facility] at [place], awaiting EMS transport.

## 2020-03-09 NOTE — ED Provider Notes (Signed)
[facility] Emergency Department Provider Note   ____________________________________________   I have reviewed the triage vital signs and the nursing notes.   HISTORY  Chief Complaint Fall  History limited by and level 5 caveat due to: Dementia   HPI Stephanie Duarte is a 86 y.o. female who presents to the emergency department today because of concerns for a fall.  Apparently the patient was witnessed falling out of her wheelchair and landing on her face.  Patient does have history of dementia and cannot give any history.   Records reviewed. Per medical record review patient has a history of dementia.  Past Medical History:  Diagnosis Date  . Asthma   . Dementia (HCC)   . Hyperlipidemia   . Hypertension     Patient Active Problem List   Diagnosis Date Noted  . Acute respiratory failure with hypoxia (HCC)   . COVID-19 03/27/2019  . DNR (do not resuscitate) 03/27/2019  . Asthma exacerbation 04/11/2018  . Pneumonia 03/12/2018  . Fever 07/04/2015    No past surgical history on file.  Prior to Admission medications   Medication Sig Start Date End Date Taking? Authorizing Provider  LORazepam (ATIVAN) 0.5 MG tablet Take 1 tablet (0.5 mg total) by mouth every 4 (four) hours as needed for anxiety. 03/31/19   Delfino Lovett, MD  morphine (ROXANOL) 20 MG/ML concentrated solution Take 0.25 mLs (5 mg total) by mouth every 2 (two) hours as needed for severe pain. 03/31/19   Delfino Lovett, MD    Allergies Meloxicam  No family history on file.  Social History Social History   Tobacco Use  . Smoking status: Never Smoker  . Smokeless tobacco: Never Used  Substance Use Topics  . Alcohol use: No    Review of Systems Unable to obtain reliable ROS secondary to dementia. ____________________________________________   PHYSICAL EXAM:  VITAL SIGNS: ED Triage Vitals  Enc Vitals Group     BP 03/09/20 1248 (!) 109/57     Pulse Rate 03/09/20 1248 74      Resp 03/09/20 1248 20     Temp 03/09/20 1248 97.9 F (36.6 C)     Temp Source 03/09/20 1248 Oral     SpO2 03/09/20 1248 95 %     Weight 03/09/20 1251 158 lb 15.2 oz (72.1 kg)     Height 03/09/20 1251 5\' 5"  (1.651 m)     Head Circumference --      Peak Flow --      Pain Score 03/09/20 1248 10   Constitutional: Awake and alert. Not oriented. Eyes: Conjunctivae are normal.  ENT      Head: Normocephalic and atraumatic.      Nose: No congestion/rhinnorhea.      Mouth/Throat: Mucous membranes are moist.      Neck: No stridor. Hematological/Lymphatic/Immunilogical: No cervical lymphadenopathy. Cardiovascular: Normal rate, regular rhythm.  No murmurs, rubs, or gallops.  Respiratory: Normal respiratory effort without tachypnea nor retractions. Breath sounds are clear and equal bilaterally. No wheezes/rales/rhonchi. Gastrointestinal: Soft and non tender. No rebound. No guarding.  Genitourinary: Deferred Musculoskeletal: Normal range of motion in all extremities. No lower extremity edema. Neurologic: Dementia. Muttering. Not answering questions. Appears to move all extremities.  Skin:  Skin is warm, dry and intact. No rash noted.  ____________________________________________    LABS (pertinent positives/negatives)  CBC wbc 9.7, hgb 12.6, plt 201 CMP na 142, k 4.3, glu 110, cr 1.23 UA turbid, positive nitrite, >50WBC, wbc clumps ____________________________________________   EKG  Lurline Idol, attending physician, personally viewed and interpreted this EKG  EKG Time: 1244 Rate: 80 Rhythm: sinus rhythm with PVC Axis: normal Intervals: qtc 470 QRS: incomplete RBBB ST changes: no st elevation Impression: abnormal ekg  ____________________________________________    RADIOLOGY  CT head/cervical spine/max face No acute intracranial traumatic finding, no acute fracture. Hematoma. Enlarging thyroid nodule.   Pelvis x-ray No acute osseous  abnormality ____________________________________________   PROCEDURES  Procedures  ____________________________________________   INITIAL IMPRESSION / ASSESSMENT AND PLAN / ED COURSE  Pertinent labs & imaging results that were available during my care of the patient were reviewed by me and considered in my medical decision making (see chart for details).   Patient presented to the emergency department today because of concerns for a fall.  While here she was found to be febrile.  Work-up was concerning for urinary tract infection.  At this time no leukocytosis.  Patient will be given dose of antibiotics here in the emergency department and discharged with further antibiotics.  ____________________________________________   FINAL CLINICAL IMPRESSION(S) / ED DIAGNOSES  Final diagnoses:  Lower urinary tract infection     Note: This dictation was prepared with Dragon dictation. Any transcriptional errors that result from this process are unintentional     Phineas Semen, MD 03/09/20 2224

## 2020-03-09 NOTE — Discharge Instructions (Addendum)
Please seek medical attention for any high fevers, chest pain, shortness of breath, change in behavior, persistent vomiting, bloody stool or any other new or concerning symptoms.  

## 2020-03-12 LAB — URINE CULTURE: Culture: >=100000 — AB

## 2020-05-09 DEATH — deceased
